# Patient Record
Sex: Male | Born: 1950 | Race: White | Hispanic: No | Smoking: Never smoker
Health system: Southern US, Community
[De-identification: ages and names within clinical notes are randomized; demographics above are authoritative.]

## PROBLEM LIST (undated history)

## (undated) DIAGNOSIS — I10 Essential (primary) hypertension: Secondary | ICD-10-CM

## (undated) DIAGNOSIS — G4700 Insomnia, unspecified: Secondary | ICD-10-CM

## (undated) DIAGNOSIS — E119 Type 2 diabetes mellitus without complications: Secondary | ICD-10-CM

## (undated) DIAGNOSIS — E785 Hyperlipidemia, unspecified: Secondary | ICD-10-CM

## (undated) HISTORY — PX: CORONARY ARTERY BYPASS GRAFT: SHX141

---

## 2005-03-31 ENCOUNTER — Emergency Department (HOSPITAL_COMMUNITY): Admission: EM | Admit: 2005-03-31 | Discharge: 2005-03-31 | Payer: Self-pay | Admitting: Emergency Medicine

## 2005-12-11 ENCOUNTER — Emergency Department (HOSPITAL_COMMUNITY): Admission: EM | Admit: 2005-12-11 | Discharge: 2005-12-11 | Payer: Self-pay | Admitting: Emergency Medicine

## 2006-04-30 ENCOUNTER — Emergency Department (HOSPITAL_COMMUNITY): Admission: EM | Admit: 2006-04-30 | Discharge: 2006-04-30 | Payer: Self-pay | Admitting: Emergency Medicine

## 2006-07-01 ENCOUNTER — Emergency Department (HOSPITAL_COMMUNITY): Admission: EM | Admit: 2006-07-01 | Discharge: 2006-07-01 | Payer: Self-pay | Admitting: Emergency Medicine

## 2016-10-18 ENCOUNTER — Emergency Department (HOSPITAL_COMMUNITY): Payer: Medicare Other

## 2016-10-18 ENCOUNTER — Observation Stay (HOSPITAL_COMMUNITY)
Admission: EM | Admit: 2016-10-18 | Discharge: 2016-10-20 | Disposition: A | Discharge status: Home / Self Care | Payer: Medicare Other | Attending: Nephrology | Admitting: Nephrology

## 2016-10-18 ENCOUNTER — Encounter (HOSPITAL_COMMUNITY): Payer: Self-pay | Admitting: Internal Medicine

## 2016-10-18 DIAGNOSIS — E11649 Type 2 diabetes mellitus with hypoglycemia without coma: Secondary | ICD-10-CM | POA: Diagnosis not present

## 2016-10-18 DIAGNOSIS — K219 Gastro-esophageal reflux disease without esophagitis: Secondary | ICD-10-CM | POA: Diagnosis present

## 2016-10-18 DIAGNOSIS — E1122 Type 2 diabetes mellitus with diabetic chronic kidney disease: Secondary | ICD-10-CM | POA: Insufficient documentation

## 2016-10-18 DIAGNOSIS — I25810 Atherosclerosis of coronary artery bypass graft(s) without angina pectoris: Secondary | ICD-10-CM | POA: Diagnosis present

## 2016-10-18 DIAGNOSIS — N183 Chronic kidney disease, stage 3 unspecified: Secondary | ICD-10-CM | POA: Diagnosis present

## 2016-10-18 DIAGNOSIS — I129 Hypertensive chronic kidney disease with stage 1 through stage 4 chronic kidney disease, or unspecified chronic kidney disease: Secondary | ICD-10-CM | POA: Diagnosis not present

## 2016-10-18 DIAGNOSIS — I10 Essential (primary) hypertension: Secondary | ICD-10-CM | POA: Diagnosis present

## 2016-10-18 DIAGNOSIS — R7401 Elevation of levels of liver transaminase levels: Secondary | ICD-10-CM

## 2016-10-18 DIAGNOSIS — Z79899 Other long term (current) drug therapy: Secondary | ICD-10-CM | POA: Insufficient documentation

## 2016-10-18 DIAGNOSIS — R740 Nonspecific elevation of levels of transaminase and lactic acid dehydrogenase [LDH]: Secondary | ICD-10-CM | POA: Insufficient documentation

## 2016-10-18 DIAGNOSIS — G9341 Metabolic encephalopathy: Secondary | ICD-10-CM | POA: Diagnosis not present

## 2016-10-18 DIAGNOSIS — R634 Abnormal weight loss: Secondary | ICD-10-CM | POA: Diagnosis present

## 2016-10-18 DIAGNOSIS — E162 Hypoglycemia, unspecified: Secondary | ICD-10-CM | POA: Diagnosis not present

## 2016-10-18 DIAGNOSIS — I2510 Atherosclerotic heart disease of native coronary artery without angina pectoris: Secondary | ICD-10-CM | POA: Diagnosis not present

## 2016-10-18 DIAGNOSIS — Z7982 Long term (current) use of aspirin: Secondary | ICD-10-CM | POA: Diagnosis not present

## 2016-10-18 DIAGNOSIS — Z23 Encounter for immunization: Secondary | ICD-10-CM | POA: Diagnosis not present

## 2016-10-18 DIAGNOSIS — Z794 Long term (current) use of insulin: Secondary | ICD-10-CM | POA: Diagnosis not present

## 2016-10-18 DIAGNOSIS — Z7902 Long term (current) use of antithrombotics/antiplatelets: Secondary | ICD-10-CM | POA: Insufficient documentation

## 2016-10-18 HISTORY — DX: Type 2 diabetes mellitus without complications: E11.9

## 2016-10-18 LAB — CBC WITH DIFFERENTIAL/PLATELET
Basophils Absolute: 0 10*3/uL (ref 0.0–0.1)
Basophils Relative: 1 %
EOS PCT: 11 %
Eosinophils Absolute: 1 10*3/uL — ABNORMAL HIGH (ref 0.0–0.7)
HCT: 46.8 % (ref 39.0–52.0)
Hemoglobin: 15.2 g/dL (ref 13.0–17.0)
LYMPHS ABS: 1.1 10*3/uL (ref 0.7–4.0)
LYMPHS PCT: 12 %
MCH: 29.6 pg (ref 26.0–34.0)
MCHC: 32.5 g/dL (ref 30.0–36.0)
MCV: 91.1 fL (ref 78.0–100.0)
MONO ABS: 0.5 10*3/uL (ref 0.1–1.0)
MONOS PCT: 6 %
Neutro Abs: 6 10*3/uL (ref 1.7–7.7)
Neutrophils Relative %: 70 %
PLATELETS: 284 10*3/uL (ref 150–400)
RBC: 5.14 MIL/uL (ref 4.22–5.81)
RDW: 15.1 % (ref 11.5–15.5)
WBC: 8.6 10*3/uL (ref 4.0–10.5)

## 2016-10-18 LAB — URINALYSIS, ROUTINE W REFLEX MICROSCOPIC
BILIRUBIN URINE: NEGATIVE
GLUCOSE, UA: 50 mg/dL — AB
HGB URINE DIPSTICK: NEGATIVE
Ketones, ur: NEGATIVE mg/dL
LEUKOCYTES UA: NEGATIVE
NITRITE: NEGATIVE
PH: 5 (ref 5.0–8.0)
Protein, ur: 30 mg/dL — AB
RBC / HPF: NONE SEEN RBC/hpf (ref 0–5)
SPECIFIC GRAVITY, URINE: 1.025 (ref 1.005–1.030)

## 2016-10-18 LAB — COMPREHENSIVE METABOLIC PANEL
ALBUMIN: 3.9 g/dL (ref 3.5–5.0)
ALT: 40 U/L (ref 17–63)
ANION GAP: 11 (ref 5–15)
AST: 31 U/L (ref 15–41)
Alkaline Phosphatase: 195 U/L — ABNORMAL HIGH (ref 38–126)
BILIRUBIN TOTAL: 0.6 mg/dL (ref 0.3–1.2)
BUN: 17 mg/dL (ref 6–20)
CHLORIDE: 100 mmol/L — AB (ref 101–111)
CO2: 28 mmol/L (ref 22–32)
Calcium: 9.3 mg/dL (ref 8.9–10.3)
Creatinine, Ser: 1.29 mg/dL — ABNORMAL HIGH (ref 0.61–1.24)
GFR calc Af Amer: >60 mL/min (ref >60)
GFR, EST NON AFRICAN AMERICAN: 56 mL/min — AB (ref >60)
Glucose, Bld: 57 mg/dL — ABNORMAL LOW (ref 65–99)
POTASSIUM: 3.9 mmol/L (ref 3.5–5.1)
Sodium: 139 mmol/L (ref 135–145)
TOTAL PROTEIN: 7.5 g/dL (ref 6.5–8.1)

## 2016-10-18 LAB — SEDIMENTATION RATE: SED RATE: 10 mm/h (ref 0–16)

## 2016-10-18 LAB — RAPID URINE DRUG SCREEN, HOSP PERFORMED
AMPHETAMINES: NOT DETECTED
Barbiturates: NOT DETECTED
Benzodiazepines: NOT DETECTED
Cocaine: NOT DETECTED
Opiates: POSITIVE — AB
Tetrahydrocannabinol: NOT DETECTED

## 2016-10-18 LAB — CBG MONITORING, ED
GLUCOSE-CAPILLARY: 49 mg/dL — AB (ref 65–99)
GLUCOSE-CAPILLARY: 46 mg/dL — AB (ref 65–99)
GLUCOSE-CAPILLARY: 230 mg/dL — AB (ref 65–99)
Glucose-Capillary: 197 mg/dL — ABNORMAL HIGH (ref 65–99)

## 2016-10-18 LAB — TSH: TSH: 1.588 u[IU]/mL (ref 0.350–4.500)

## 2016-10-18 LAB — GLUCOSE, CAPILLARY: GLUCOSE-CAPILLARY: 161 mg/dL — AB (ref 65–99)

## 2016-10-18 MED ORDER — ALBUTEROL SULFATE (2.5 MG/3ML) 0.083% IN NEBU: 2.5000 mg | INHALATION_SOLUTION | RESPIRATORY_TRACT | Status: DC | PRN

## 2016-10-18 MED ORDER — ACETAMINOPHEN 325 MG PO TABS
650.0000 mg | ORAL_TABLET | Freq: Four times a day (QID) | ORAL | Status: DC | PRN
  Administered 2016-10-19: 650 mg via ORAL
  Filled 2016-10-18: qty 2

## 2016-10-18 MED ORDER — DEXTROSE 50 % IV SOLN
1.0000 | Freq: Once | INTRAVENOUS | Status: AC
  Administered 2016-10-18: 50 mL via INTRAVENOUS
  Filled 2016-10-18: qty 50

## 2016-10-18 MED ORDER — SODIUM CHLORIDE 0.9 % IV BOLUS (SEPSIS)
500.0000 mL | Freq: Once | INTRAVENOUS | Status: AC
  Administered 2016-10-18: 500 mL via INTRAVENOUS

## 2016-10-18 MED ORDER — ONDANSETRON HCL 4 MG/2ML IJ SOLN: 4.0000 mg | Freq: Four times a day (QID) | INTRAMUSCULAR | Status: DC | PRN

## 2016-10-18 MED ORDER — GI COCKTAIL ~~LOC~~
30.0000 mL | ORAL | Status: AC
  Filled 2016-10-18 (×2): qty 30

## 2016-10-18 MED ORDER — ENOXAPARIN SODIUM 40 MG/0.4ML ~~LOC~~ SOLN
40.0000 mg | Freq: Every day | SUBCUTANEOUS | Status: DC
Start: 2016-10-19 — End: 2016-10-20
  Administered 2016-10-19 – 2016-10-20 (×2): 40 mg via SUBCUTANEOUS
  Filled 2016-10-18 (×2): qty 0.4

## 2016-10-18 MED ORDER — PANTOPRAZOLE SODIUM 40 MG PO TBEC
40.0000 mg | DELAYED_RELEASE_TABLET | Freq: Every day | ORAL | Status: DC
  Administered 2016-10-19 – 2016-10-20 (×2): 40 mg via ORAL
  Filled 2016-10-18 (×2): qty 1

## 2016-10-18 MED ORDER — ACETAMINOPHEN 650 MG RE SUPP: 650.0000 mg | Freq: Four times a day (QID) | RECTAL | Status: DC | PRN

## 2016-10-18 MED ORDER — ONDANSETRON HCL 4 MG PO TABS: 4.0000 mg | ORAL_TABLET | Freq: Four times a day (QID) | ORAL | Status: DC | PRN

## 2016-10-18 MED ORDER — DEXTROSE-NACL 5-0.45 % IV SOLN
INTRAVENOUS | Status: DC
  Administered 2016-10-18: 18:00:00 via INTRAVENOUS

## 2016-10-18 MED ORDER — SODIUM CHLORIDE 0.9% FLUSH: 10.0000 mL | INTRAVENOUS | Status: DC | PRN

## 2016-10-18 NOTE — ED Notes (Signed)
Attempted to call report

## 2016-10-18 NOTE — ED Notes (Signed)
Add on ESR label sent to main lab.

## 2016-10-18 NOTE — ED Triage Notes (Signed)
Pt BIB EMS for hypoglycemia. Per EMS they were called out d/t AMS, pt CBG 37 on assessment. Given oral glucose and 12 g D10 PTA; CBG went up to 80. Pt A&Ox4 on arrival, resp e/u; CBG recheck 49. PIV not established. Pt given PO OJ; EDP at bedisde.  Pt endorses increased weakness, decreased appetite, intermittent diarrhea and nausea x 1 week.

## 2016-10-18 NOTE — ED Notes (Signed)
CBG 230; EDP notified. States to continue dextrose.

## 2016-10-18 NOTE — ED Provider Notes (Signed)
[facility]-EMERGENCY DEPT Provider Note   CSN: [id number] Arrival date & time: 10/18/16  1627     History   Chief Complaint Chief Complaint  Patient presents with  . Hypoglycemia    HPI Philip [name] is a 66 y.o. male.  HPI Patient presents with hypoglycemia. History of diabetes. He is on insulin, metformin, and the shot he takes once a week. Had some confusion home and sugars were 37 for EMS. Given D10 at home. Sugars have come up some. States his sugars have been going down frequently. States he is unsure why. States he has cut his insulin back to half the dose. Recently moved on and get seen over in [place]. States he has lost around 30 pounds over the last few months. He's had a decreased appetite has had decreased oral intake. No abdominal pain. No dysuria. States his mouth feels dry. He does not smoke. No blood in the stool. Has some darkening around his right eye and right forehead area. She gets this when he does not sleep well. No past medical history on file.  There are no active problems to display for this patient.   No past surgical history on file.     Home Medications    Prior to Admission medications   Medication Sig Start Date End Date Taking? Authorizing Provider  amLODipine (NORVASC) 5 MG tablet Take 5 mg by mouth daily.   Yes [provider]  aspirin EC 81 MG tablet Take 81 mg by mouth daily.   Yes [provider]  calcium carbonate (TUMS - DOSED IN MG ELEMENTAL CALCIUM) 500 MG chewable tablet Chew 2 tablets by mouth as needed for indigestion or heartburn.   Yes [provider]  citalopram (CELEXA) 20 MG tablet Take 20 mg by mouth daily.   Yes [provider]  clopidogrel (PLAVIX) 75 MG tablet Take 75 mg by mouth daily.   Yes [provider]  doxylamine, Sleep, (UNISOM) 25 MG tablet Take 25 mg by mouth at bedtime as needed for sleep.   Yes [provider]  Dulaglutide (TRULICITY) 0.75 MG/0.5ML SOPN  Inject into the skin once a week.   Yes [provider]  Esomeprazole Magnesium (NEXIUM PO) Take 1 tablet by mouth 4 (four) times daily as needed (Heartburn).   Yes [provider]  Loperamide HCl (IMODIUM A-D PO) Take 1 tablet by mouth as needed.   Yes [provider]  metFORMIN (GLUCOPHAGE) 1000 MG tablet Take 1,000 mg by mouth 2 (two) times daily with a meal.   Yes [provider]  metoprolol tartrate (LOPRESSOR) 25 MG tablet Take 25 mg by mouth 2 (two) times daily.   Yes [provider]  nitroGLYCERIN (NITROLINGUAL) 0.4 MG/SPRAY spray Place 1 spray under the tongue every 5 (five) minutes x 3 doses as needed for chest pain.   Yes [provider]    Family History No family history on file.  Social History Social History  Substance Use Topics  . Smoking status: Not on file  . Smokeless tobacco: Not on file  . Alcohol use Not on file     Allergies   Codeine   Review of Systems Review of Systems  Constitutional: Positive for appetite change and unexpected weight change.  HENT: Negative for congestion.   Eyes: Negative for photophobia.  Respiratory: Negative for shortness of breath.   Cardiovascular: Negative for chest pain.  Gastrointestinal: Negative for abdominal pain, diarrhea, nausea and vomiting.  Endocrine: Negative for polyuria.  Genitourinary: Negative for flank pain.  Musculoskeletal: Negative for back pain.  Neurological: Negative for syncope and numbness.  Hematological: Does not bruise/bleed easily.  Psychiatric/Behavioral: Positive for confusion.     Physical Exam Updated Vital Signs BP 127/72   Pulse 76   Temp (!) 97.4 F (36.3 C) (Oral)   Resp 16   Ht  (1.702 m)   Wt 70.3 kg (155 lb)   SpO2 99%   BMI 24.28 kg/m   Physical Exam  Constitutional: He appears well-developed.  HENT:  Mucous membranes are dry. Some periorbital darkening on the right side and goes up to the right temporal area.  No swelling.  Eyes: EOM are normal.  Cardiovascular: Normal rate.   Pulmonary/Chest: No respiratory distress. He has no wheezes.  Abdominal: Soft. There is no tenderness.  Musculoskeletal: He exhibits no edema.  Neurological: He is alert.  Skin: Skin is warm. Capillary refill takes less than 2 seconds.     ED Treatments / Results  Labs (all labs ordered are listed, but only abnormal results are displayed) Labs Reviewed  COMPREHENSIVE METABOLIC PANEL - Abnormal; Notable for the following:       Result Value   Chloride 100 (*)    Glucose, Bld 57 (*)    Creatinine, Ser 1.29 (*)    Alkaline Phosphatase 195 (*)    GFR calc non Af Amer 56 (*)    All other components within normal limits  CBC WITH DIFFERENTIAL/PLATELET - Abnormal; Notable for the following:    Eosinophils Absolute 1.0 (*)    All other components within normal limits  CBG MONITORING, ED - Abnormal; Notable for the following:    Glucose-Capillary 49 (*)    All other components within normal limits  CBG MONITORING, ED - Abnormal; Notable for the following:    Glucose-Capillary 46 (*)    All other components within normal limits  CBG MONITORING, ED - Abnormal; Notable for the following:    Glucose-Capillary 230 (*)    All other components within normal limits  TSH  URINALYSIS, ROUTINE W REFLEX MICROSCOPIC  RAPID URINE DRUG SCREEN, HOSP PERFORMED  CBG MONITORING, ED    EKG  EKG Interpretation None       Radiology Dg Chest 2 View  Result Date: 10/18/2016 CLINICAL DATA:  Hypoglycemia. Altered mental status. Nausea for 1 week. EXAM: CHEST  2 VIEW COMPARISON:  12/11/2005 FINDINGS: The lungs are clear. The pulmonary vasculature is normal. Heart size is normal. Hilar and mediastinal contours are unremarkable. There is no pleural effusion. Prior sternotomy. IMPRESSION: No active cardiopulmonary disease. Electronically Signed   By: Ellery Plunk M.D.   On: 10/18/2016 18:21    Procedures Procedures (including  critical care time)  Medications Ordered in ED Medications  dextrose 5 %-0.45 % sodium chloride infusion ( Intravenous New Bag/Given 10/18/16 1743)  sodium chloride 0.9 % bolus 500 mL (0 mLs Intravenous Stopped 10/18/16 1839)  dextrose 50 % solution 50 mL (50 mLs Intravenous Given 10/18/16 1743)     Initial Impression / Assessment and Plan / ED Course  I have reviewed the triage vital signs and the nursing notes.  Pertinent labs & imaging results that were available during my care of the patient were reviewed by me and considered in my medical decision making (see chart for details).   patient with hypoglycemia. Also has had weight loss. Has had decreased appetite and some diarrhea with some decreased oral intake but states for the last few months he has  not really had any appetite at all. Sugar 37 at home. In the 40s here after oral and IV treatment started on D5 drip and increased oral intake. Will require admission for further monitoring.  Final Clinical Impressions(s) / ED Diagnoses   Final diagnoses:  Hypoglycemia  Weight loss, unintentional    New Prescriptions New Prescriptions   No medications on file     Benjiman Core, MD 10/18/16 1932

## 2016-10-18 NOTE — H&P (Addendum)
History and Physical    Philip [name] VOH:[id number] DOB: 04-14-1950 DOA: 10/18/2016  Referring MD/NP/PA: Dr. Davonna Belling PCP: Patient, No Pcp Per  Patient coming from: Home via EMS  Chief Complaint: Low blood sugar  HPI: Orie Louderback is a 66 y.o. male with medical history significant of HTN, DM type II, CAD s/p CABG; who presents after being found with on the floor by his grandson. Patient's daughter with whom he now lives since moving from [place], [place] checked his blood sugar and it was 27 that time. Patient notes being on metformin, Trulicity, and 69/48 insulin. He is self prescribes the dosage of 70/30 insulin, and currently reported taking 30 units subcutaneously twice daily. Last reports taking Trulicity 2 days ago and 30 units of 70/30 insulin this morning. Family members note that patient had a similar episode like this approximately one month ago where he was found in his blood sugar was 11 at that time. The patient notes that he does not routinely check his blood sugars, but since moving his daughter checks them twice daily. Other associated symptoms include decreased overall appetite, weight loss of 30 pounds, nausea, and intermittent diarrhea. His previous PCP was in [place] but since moving here is not establish care with a primary care provider in this area. He has never had a colonoscopy or prostate screening.   ED Course:  Upon EMS arrival patient was noted to have CBG 37 for which he was given oral glucose and 12g of D10. CBG went up to 80, but recheck on arrival was 49. Patient's vital signs were relatively within normal limits. Labs revealed BUN 17, creatinine 1.29, glucose 57, TSH wnl, and alkaline phosphatase 195. He was given some orange juice and subsequently required an amp of D50 as CBGs were reported as low as 46. Patient was subsequently placed on D5 .45NS at 75 ml/hr. TRH called to admit overnight.   Review of Systems  Constitutional: Positive for  weight loss. Negative for chills and fever.  HENT: Negative for ear discharge and ear pain.   Eyes: Negative for photophobia and pain.  Respiratory: Negative for hemoptysis and sputum production.   Cardiovascular: Negative for orthopnea and leg swelling.  Gastrointestinal: Positive for diarrhea and nausea. Negative for blood in stool and constipation.  Genitourinary: Negative for dysuria and frequency.  Musculoskeletal: Negative for back pain and neck pain.  Skin: Positive for rash (Chronic gray rash over right). Negative for itching.  Neurological: Negative for focal weakness and seizures.  Psychiatric/Behavioral: Negative for hallucinations and substance abuse.    Past Medical History:  Diagnosis Date  . Diabetes mellitus ([place])     Past Surgical History:  Procedure Laterality Date  . CORONARY ARTERY BYPASS GRAFT       has no tobacco, alcohol, and drug history on file.  Allergies  Allergen Reactions  . Codeine     GI upset    Family History  Problem Relation Age of Onset  . Colon cancer Father     Prior to Admission medications   Medication Sig Start Date End Date Taking? Authorizing Provider  amLODipine (NORVASC) 5 MG tablet Take 5 mg by mouth daily.   Yes [provider]  aspirin EC 81 MG tablet Take 81 mg by mouth daily.   Yes [provider]  calcium carbonate (TUMS - DOSED IN MG ELEMENTAL CALCIUM) 500 MG chewable tablet Chew 2 tablets by mouth as needed for indigestion or heartburn.   Yes [provider]  citalopram (  CELEXA) 20 MG tablet Take 20 mg by mouth daily.   Yes [provider]  clopidogrel (PLAVIX) 75 MG tablet Take 75 mg by mouth daily.   Yes [provider]  doxylamine, Sleep, (UNISOM) 25 MG tablet Take 25 mg by mouth at bedtime as needed for sleep.   Yes [provider]  Dulaglutide (TRULICITY) 7.00 FV/4.9SW SOPN Inject into the skin once a week.   Yes [provider]  Esomeprazole Magnesium  (NEXIUM PO) Take 1 tablet by mouth 4 (four) times daily as needed (Heartburn).   Yes [provider]  Loperamide HCl (IMODIUM A-D PO) Take 1 tablet by mouth as needed.   Yes [provider]  metFORMIN (GLUCOPHAGE) 1000 MG tablet Take 1,000 mg by mouth 2 (two) times daily with a meal.   Yes [provider]  metoprolol tartrate (LOPRESSOR) 25 MG tablet Take 25 mg by mouth 2 (two) times daily.   Yes [provider]  nitroGLYCERIN (NITROLINGUAL) 0.4 MG/SPRAY spray Place 1 spray under the tongue every 5 (five) minutes x 3 doses as needed for chest pain.   Yes [provider]    Physical Exam:  Constitutional: Elderly male who appears to be in NAD, calm, comfortable Vitals:   10/18/16 1655 10/18/16 1700 10/18/16 1715 10/18/16 1927  BP: (!) 118/59 112/76 116/70 127/72  Pulse: 100 74 74 76  Resp: 16     Temp: (!) 97.4 F (36.3 C)     TempSrc: Oral     SpO2: 98% 100% 98% 99%  Weight: 70.3 kg (155 lb)     Height: '5\' 7"'  (1.702 m)      Eyes: PERRL, lids and conjunctivae normal ENMT: Mucous membranes are dry. Posterior pharynx clear of any exudate or lesions.  Neck: normal, supple, no masses, no thyromegaly Respiratory: clear to auscultation bilaterally, no wheezing, no crackles. Normal respiratory effort. No accessory muscle use.  Cardiovascular: Regular rate and rhythm, no murmurs / rubs / gallops. No extremity edema. 2+ pedal pulses. No carotid bruits.  Abdomen: no tenderness, no masses palpated. No hepatosplenomegaly. Bowel sounds positive.  Musculoskeletal: no clubbing / cyanosis. No joint deformity upper and lower extremities. Good ROM, no contractures. Normal muscle tone.  Skin:  Pearline Cables coloration of the right eye Neurologic: CN 2-12 grossly intact. Sensation intact, DTR normal. Strength 5/5 in all 4.  Psychiatric: Normal judgment and insight. Alert and oriented x 3. Normal mood.     Labs on Admission: I have personally reviewed following labs  and imaging studies  CBC:  Recent Labs Lab 10/18/16 1704  WBC 8.6  NEUTROABS 6.0  HGB 15.2  HCT 46.8  MCV 91.1  PLT 967   Basic Metabolic Panel:  Recent Labs Lab 10/18/16 1704  NA 139  K 3.9  CL 100*  CO2 28  GLUCOSE 57*  BUN 17  CREATININE 1.29*  CALCIUM 9.3   GFR: Estimated Creatinine Clearance: 52 mL/min (A) (by C-G formula based on SCr of 1.29 mg/dL (H)). Liver Function Tests:  Recent Labs Lab 10/18/16 1704  AST 31  ALT 40  ALKPHOS 195*  BILITOT 0.6  PROT 7.5  ALBUMIN 3.9   No results for input(s): LIPASE, AMYLASE in the last 168 hours. No results for input(s): AMMONIA in the last 168 hours. Coagulation Profile: No results for input(s): INR, PROTIME in the last 168 hours. Cardiac Enzymes: No results for input(s): CKTOTAL, CKMB, CKMBINDEX, TROPONINI in the last 168 hours. BNP (last 3 results) No results for input(s):  PROBNP in the last 8760 hours. HbA1C: No results for input(s): HGBA1C in the last 72 hours. CBG:  Recent Labs Lab 10/18/16 1654 10/18/16 1734 10/18/16 1841  GLUCAP 49* 46* 230*   Lipid Profile: No results for input(s): CHOL, HDL, LDLCALC, TRIG, CHOLHDL, LDLDIRECT in the last 72 hours. Thyroid Function Tests:  Recent Labs  10/18/16 1730  TSH 1.588   Anemia Panel: No results for input(s): VITAMINB12, FOLATE, FERRITIN, TIBC, IRON, RETICCTPCT in the last 72 hours. Urine analysis:    Component Value Date/Time   COLORURINE YELLOW 10/18/2016 1927   APPEARANCEUR CLEAR 10/18/2016 1927   LABSPEC 1.025 10/18/2016 1927   PHURINE 5.0 10/18/2016 1927   GLUCOSEU 50 (A) 10/18/2016 1927   HGBUR NEGATIVE 10/18/2016 [place] NEGATIVE 10/18/2016 [place] NEGATIVE 10/18/2016 1927   PROTEINUR 30 (A) 10/18/2016 1927   NITRITE NEGATIVE 10/18/2016 1927   LEUKOCYTESUR NEGATIVE 10/18/2016 1927   Sepsis Labs: No results found for this or any previous visit (from the past 240 hour(s)).   Radiological Exams on  Admission: Dg Chest 2 View  Result Date: 10/18/2016 CLINICAL DATA:  Hypoglycemia. Altered mental status. Nausea for 1 week. EXAM: CHEST  2 VIEW COMPARISON:  12/11/2005 FINDINGS: The lungs are clear. The pulmonary vasculature is normal. Heart size is normal. Hilar and mediastinal contours are unremarkable. There is no pleural effusion. Prior sternotomy. IMPRESSION: No active cardiopulmonary disease. Electronically Signed   By: Andreas Newport M.D.   On: 10/18/2016 18:21    Chest x-ray: Independently reviewed. No acute abnormalities  Assessment/Plan Hypoglycemia: Acute. Initial blood glucose to be as low as 27. Patient reports self prescribing his dosage of 70/30 insulin taking 30 units twice daily despite multiple recent hypoglycemic episodes. Patient received - Admit to a telemetry bed - Hypoglycemic protocols - Check hemoglobin A1c in a.m. - Hold metformin, Trulicity, and 14/64 insulin - D5.45% NS at 75 ml/hr - CBGs every 4 hours overnight - Adjust medications in a.m.   Essential hypertension - Continue amlodipine and metoprolol   CAD s/p CABG - Continue Plavix and aspirin   Chronic kidney disease stage III: Creatinine 1.29 on admission no baseline creatinine to compare. - Recheck BMP in a.m.  Depression - Continue Celexa  Weight loss: Patient reports approximately 30 pound weight loss. Noted to have normal TSH on admission. Question possibility of underlying malignancy or uncontrolled diabetes is causing symptoms. - check ESR - Encourage patient to have routine screening for age  Elevated alkaline phosphatase: Alkaline phosphatase elevated at 195 on admission.  [place] substitution of Protonix   DVT prophylaxis: lovenox Code Status: Full  Family Communication: Discussed plan of care with patient and family present at bedside Disposition Plan:   Consults called:  none Admission status: Observation  Norval Morton MD [facility] Pager  [phone]   If 7PM-7AM, please contact night-coverage [email or web address] Password [facility]  10/18/2016, 7:57 PM

## 2016-10-18 NOTE — ED Notes (Signed)
Checked CBG 197, RN Jessica informed

## 2016-10-18 NOTE — ED Notes (Signed)
Attempted PIV x2. 2nd RN to attempt.  

## 2016-10-19 ENCOUNTER — Observation Stay (HOSPITAL_COMMUNITY): Payer: Medicare Other

## 2016-10-19 ENCOUNTER — Encounter (HOSPITAL_COMMUNITY): Payer: Self-pay | Admitting: *Deleted

## 2016-10-19 DIAGNOSIS — I25810 Atherosclerosis of coronary artery bypass graft(s) without angina pectoris: Secondary | ICD-10-CM | POA: Diagnosis present

## 2016-10-19 DIAGNOSIS — E11649 Type 2 diabetes mellitus with hypoglycemia without coma: Secondary | ICD-10-CM | POA: Diagnosis not present

## 2016-10-19 DIAGNOSIS — K219 Gastro-esophageal reflux disease without esophagitis: Secondary | ICD-10-CM | POA: Diagnosis present

## 2016-10-19 DIAGNOSIS — I10 Essential (primary) hypertension: Secondary | ICD-10-CM | POA: Diagnosis not present

## 2016-10-19 DIAGNOSIS — R7401 Elevation of levels of liver transaminase levels: Secondary | ICD-10-CM

## 2016-10-19 DIAGNOSIS — E162 Hypoglycemia, unspecified: Secondary | ICD-10-CM | POA: Diagnosis not present

## 2016-10-19 DIAGNOSIS — R634 Abnormal weight loss: Secondary | ICD-10-CM | POA: Diagnosis present

## 2016-10-19 DIAGNOSIS — R740 Nonspecific elevation of levels of transaminase and lactic acid dehydrogenase [LDH]: Secondary | ICD-10-CM

## 2016-10-19 DIAGNOSIS — N183 Chronic kidney disease, stage 3 unspecified: Secondary | ICD-10-CM | POA: Diagnosis present

## 2016-10-19 LAB — CBC
HEMATOCRIT: 35.2 % — AB (ref 39.0–52.0)
Hemoglobin: 11.3 g/dL — ABNORMAL LOW (ref 13.0–17.0)
MCH: 28.9 pg (ref 26.0–34.0)
MCHC: 32.4 g/dL (ref 30.0–36.0)
MCV: 89.3 fL (ref 78.0–100.0)
Platelets: 222 10*3/uL (ref 150–400)
RBC: 3.94 MIL/uL — ABNORMAL LOW (ref 4.22–5.81)
RDW: 14.9 % (ref 11.5–15.5)
WBC: 7.9 10*3/uL (ref 4.0–10.5)

## 2016-10-19 LAB — COMPREHENSIVE METABOLIC PANEL
ALT: 83 U/L — AB (ref 17–63)
ANION GAP: 8 (ref 5–15)
AST: 90 U/L — ABNORMAL HIGH (ref 15–41)
Albumin: 2.7 g/dL — ABNORMAL LOW (ref 3.5–5.0)
Alkaline Phosphatase: 279 U/L — ABNORMAL HIGH (ref 38–126)
BUN: 15 mg/dL (ref 6–20)
CHLORIDE: 103 mmol/L (ref 101–111)
CO2: 26 mmol/L (ref 22–32)
CREATININE: 1.12 mg/dL (ref 0.61–1.24)
Calcium: 8.1 mg/dL — ABNORMAL LOW (ref 8.9–10.3)
GFR calc non Af Amer: >60 mL/min (ref >60)
Glucose, Bld: 227 mg/dL — ABNORMAL HIGH (ref 65–99)
POTASSIUM: 4.6 mmol/L (ref 3.5–5.1)
SODIUM: 137 mmol/L (ref 135–145)
Total Bilirubin: 0.8 mg/dL (ref 0.3–1.2)
Total Protein: 5.2 g/dL — ABNORMAL LOW (ref 6.5–8.1)

## 2016-10-19 LAB — HEMOGLOBIN A1C
Hgb A1c MFr Bld: 6.7 % — ABNORMAL HIGH (ref 4.8–5.6)
Mean Plasma Glucose: 145.59 mg/dL

## 2016-10-19 LAB — GLUCOSE, CAPILLARY
GLUCOSE-CAPILLARY: 214 mg/dL — AB (ref 65–99)
GLUCOSE-CAPILLARY: 277 mg/dL — AB (ref 65–99)
GLUCOSE-CAPILLARY: 346 mg/dL — AB (ref 65–99)
Glucose-Capillary: 273 mg/dL — ABNORMAL HIGH (ref 65–99)
Glucose-Capillary: 188 mg/dL — ABNORMAL HIGH (ref 65–99)
Glucose-Capillary: 210 mg/dL — ABNORMAL HIGH (ref 65–99)

## 2016-10-19 MED ORDER — CITALOPRAM HYDROBROMIDE 20 MG PO TABS
20.0000 mg | ORAL_TABLET | Freq: Every day | ORAL | Status: DC
  Administered 2016-10-19 – 2016-10-20 (×2): 20 mg via ORAL
  Filled 2016-10-19 (×2): qty 1

## 2016-10-19 MED ORDER — ADULT MULTIVITAMIN W/MINERALS CH
1.0000 | ORAL_TABLET | Freq: Every day | ORAL | Status: DC
  Administered 2016-10-19 – 2016-10-20 (×2): 1 via ORAL
  Filled 2016-10-19 (×2): qty 1

## 2016-10-19 MED ORDER — CLOPIDOGREL BISULFATE 75 MG PO TABS
75.0000 mg | ORAL_TABLET | Freq: Every day | ORAL | Status: DC
  Administered 2016-10-19 – 2016-10-20 (×2): 75 mg via ORAL
  Filled 2016-10-19 (×2): qty 1

## 2016-10-19 MED ORDER — ASPIRIN EC 81 MG PO TBEC
81.0000 mg | DELAYED_RELEASE_TABLET | Freq: Every day | ORAL | Status: DC
  Administered 2016-10-19 – 2016-10-20 (×2): 81 mg via ORAL
  Filled 2016-10-19 (×2): qty 1

## 2016-10-19 MED ORDER — INFLUENZA VAC SPLIT HIGH-DOSE 0.5 ML IM SUSY
0.5000 mL | PREFILLED_SYRINGE | INTRAMUSCULAR | Status: AC
  Administered 2016-10-20: 0.5 mL via INTRAMUSCULAR
  Filled 2016-10-19 (×2): qty 0.5

## 2016-10-19 MED ORDER — AMLODIPINE BESYLATE 5 MG PO TABS
5.0000 mg | ORAL_TABLET | Freq: Every day | ORAL | Status: DC
  Administered 2016-10-19 – 2016-10-20 (×2): 5 mg via ORAL
  Filled 2016-10-19 (×2): qty 1

## 2016-10-19 MED ORDER — ENSURE ENLIVE PO LIQD
237.0000 mL | Freq: Two times a day (BID) | ORAL | Status: DC
  Administered 2016-10-19: 237 mL via ORAL

## 2016-10-19 MED ORDER — TRAZODONE HCL 50 MG PO TABS
50.0000 mg | ORAL_TABLET | Freq: Once | ORAL | Status: AC
Start: 2016-10-19 — End: 2016-10-19
  Administered 2016-10-19: 50 mg via ORAL

## 2016-10-19 MED ORDER — DIPHENHYDRAMINE HCL 25 MG PO CAPS
25.0000 mg | ORAL_CAPSULE | Freq: Every evening | ORAL | Status: DC | PRN
  Administered 2016-10-19: 25 mg via ORAL
  Filled 2016-10-19: qty 1

## 2016-10-19 MED ORDER — METOPROLOL TARTRATE 25 MG PO TABS
25.0000 mg | ORAL_TABLET | Freq: Two times a day (BID) | ORAL | Status: DC
  Administered 2016-10-19 – 2016-10-20 (×4): 25 mg via ORAL
  Filled 2016-10-19 (×4): qty 1

## 2016-10-19 NOTE — Progress Notes (Signed)
PROGRESS NOTE    Philip [name]  ZOX:[id number] DOB: 1950-10-11 DOA: 10/18/2016 PCP: Patient, No Pcp Per   Brief Narrative:   66 y.o. male with medical history significant of HTN, DM type II, CAD s/p CABG; who presents after being found with on the floor by his grandson.patient was found to have blood sugar level 27.Patient notes being on metformin, Trulicity, and 70/30 insulin. He is self prescribes the dosage of 70/30 insulin, and currently reported taking 30 units subcutaneously twice daily  Assessment & Plan:  # Hypoglycemia: likely due to self prescribing insulin.Patient was prescribed metformin and trulicity only. He brought the insulin from [facility] because he wanted to control his blood sugar better. Education provided to the patient regarding not taking medication without consulting his PCP for DM management.  -discontinue insulin and holdoral hypoglycemic agent. Blood sugar is better now.  #Acute metabolic encephalopathy in the setting of hypoglycemia. Of mental status improved. He has no focal neurological deficit.  #Transaminitis: Unknown etiology.liver enzymes are trending up today. Patient has no nausea vomiting or abdominal pain. I will ultrasound right upper quadrant and hepatitis panel. Repeat lab in the morning. Discussed with the patient.  #Essential hypertension: Continue amlodipine and metoprolol. Monitor blood pressure.  #CAD status post CABG: Continue Plavix and aspirin.  #Chronic kidney disease stage III: Monitor BMP. Avoid nephrotoxins.  DVT prophylaxis: Lovenox subcutaneous Code Status:full code Family Communication:no family at bedside Disposition Plan:currently admitted    Consultants:   none  Procedures:none Antimicrobials:none  Subjective: Seen and examined at bedside. Feels better now. Denied nausea vomiting chest pain or shortness of breath. No  Pain.  Objective: Vitals:   10/18/16 2045 10/18/16 2134 10/19/16 0424 10/19/16 1000  BP: 119/69  135/73 (!) 115/59 115/75  Pulse: 72 77 72 74  Resp:  Temp:  98.1 F (36.7 C) 98.5 F (36.9 C) 98.6 F (37 C)  TempSrc:  Oral Oral Oral  SpO2: 99% 100% 95% 96%  Weight:  72 kg (158 lb 12.8 oz)    Height:   (1.702 m)      Intake/Output Summary (Last 24 hours) at 10/19/16 1352 Last data filed at 10/19/16 1012  Gross per 24 hour  Intake              740 ml  Output                0 ml  Net              740 ml   Filed Weights   10/18/16 1655 10/18/16 2134  Weight: 70.3 kg (155 lb) 72 kg (158 lb 12.8 oz)    Examination:  General exam: Appears calm and comfortable  Respiratory system: Clear to auscultation. Respiratory effort normal. No wheezing or crackle Cardiovascular system: S1 & S2 heard, RRR.  No pedal edema. Gastrointestinal system: Abdomen is nondistended, soft and nontender. Normal bowel sounds heard. Central nervous system: Alert and oriented. No focal neurological deficits. Extremities: Symmetric 5 x 5 power. Skin: No rashes, lesions or ulcers Psychiatry: Judgement and insight appear normal. Mood & affect appropriate.     Data Reviewed: I have personally reviewed following labs and imaging studies  CBC:  Recent Labs Lab 10/18/16 1704 10/19/16 0407  WBC 8.6 7.9  NEUTROABS 6.0  --   HGB 15.2 11.3*  HCT 46.8 35.2*  MCV 91.1 89.3  PLT 284 222   Basic Metabolic Panel:  Recent Labs Lab 10/18/16 1704 10/19/16 0407  NA  139 137  K 3.9 4.6  CL 100* 103  CO2 28 26  GLUCOSE 57* 227*  BUN 17 15  CREATININE 1.29* 1.12  CALCIUM 9.3 8.1*   GFR: Estimated Creatinine Clearance: 59.8 mL/min (by C-G formula based on SCr of 1.12 mg/dL). Liver Function Tests:  Recent Labs Lab 10/18/16 1704 10/19/16 0407  AST 31 90*  ALT 40 83*  ALKPHOS 195* 279*  BILITOT 0.6 0.8  PROT 7.5 5.2*  ALBUMIN 3.9 2.7*   No results for input(s): LIPASE, AMYLASE in the last 168 hours. No results for input(s): AMMONIA in the last 168 hours. Coagulation  Profile: No results for input(s): INR, PROTIME in the last 168 hours. Cardiac Enzymes: No results for input(s): CKTOTAL, CKMB, CKMBINDEX, TROPONINI in the last 168 hours. BNP (last 3 results) No results for input(s): PROBNP in the last 8760 hours. HbA1C:  Recent Labs  10/19/16 0407  HGBA1C 6.7*   CBG:  Recent Labs Lab 10/18/16 2020 10/18/16 2139 10/19/16 0117 10/19/16 0428 10/19/16 0807  GLUCAP 197* 161* 273* 214* 188*   Lipid Profile: No results for input(s): CHOL, HDL, LDLCALC, TRIG, CHOLHDL, LDLDIRECT in the last 72 hours. Thyroid Function Tests:  Recent Labs  10/18/16 1730  TSH 1.588   Anemia Panel: No results for input(s): VITAMINB12, FOLATE, FERRITIN, TIBC, IRON, RETICCTPCT in the last 72 hours. Sepsis Labs: No results for input(s): PROCALCITON, LATICACIDVEN in the last 168 hours.  No results found for this or any previous visit (from the past 240 hour(s)).       Radiology Studies: Dg Chest 2 View  Result Date: 10/18/2016 CLINICAL DATA:  Hypoglycemia. Altered mental status. Nausea for 1 week. EXAM: CHEST  2 VIEW COMPARISON:  12/11/2005 FINDINGS: The lungs are clear. The pulmonary vasculature is normal. Heart size is normal. Hilar and mediastinal contours are unremarkable. There is no pleural effusion. Prior sternotomy. IMPRESSION: No active cardiopulmonary disease. Electronically Signed   By: Ellery Plunk M.D.   On: 10/18/2016 18:21        Scheduled Meds: . amLODipine  5 mg Oral Daily  . aspirin EC  81 mg Oral Daily  . citalopram  20 mg Oral Daily  . clopidogrel  75 mg Oral Daily  . enoxaparin (LOVENOX) injection  40 mg Subcutaneous Daily  . feeding supplement (ENSURE ENLIVE)  237 mL Oral BID BM  . gi cocktail  30 mL Oral STAT  . [START ON 10/20/2016] Influenza vac split quadrivalent PF  0.5 mL Intramuscular Tomorrow-1000  . metoprolol tartrate  25 mg Oral BID  . pantoprazole  40 mg Oral Daily   Continuous Infusions:   LOS: 0 days     Carylon Tamburro Jaynie Collins, MD [facility] Pager [phone]  If 7PM-7AM, please contact night-coverage [email or web address] Password [email or web address] 10/19/2016, 1:52 PM

## 2016-10-19 NOTE — Progress Notes (Signed)
Initial Nutrition Assessment  DOCUMENTATION CODES:  Non-severe (moderate) malnutrition in context of acute illness/injury  INTERVENTION:  Continue Ensure Enlive po BID, each supplement provides 350 kcal and 20 grams of protein  Order mvi with minerals given prolonged period of poor po intake, wound, and no supplementation at home  Food requests noted, to be placed on tray.   NUTRITION DIAGNOSIS:  Unintentional weight loss related to poor appetite as evidenced by Reported wt loss of 30 lbs over past few months.  GOAL:  Patient will meet greater than or equal to 90% of their needs  MONITOR:  PO intake, Supplement acceptance, Diet advancement, Labs, Weight trends, Skin, Workup  REASON FOR ASSESSMENT:  Malnutrition Screening Tool    ASSESSMENT:  66 y/o male PMHx HTN, DM2, CKD3, CAD s/p CABG, CVA.  Presents after being found on floor by grandson. BG checked and was 27 mg/dl. Additionally, patient reports a poor appetite, weight loss of 30 lbs in past few months, nausea, intermittent diarrhea. Work up revealed elevated Alk phos. Admitted for management of blood sugars.   Patients liver enzymes increased today and to undergo [place] of liver this afternoon.   Patient reports that he has very minimal appetite. He will sit down, take a few bites, but then immediately lose all appetite and stop eating. This has been going on for approximately since he started losing weight a few months ago. At baseline he does not take vitamins, dirnk supplements, or follow a diabetic diet. Infact, he says he is a "terrible diabetic". He never checked his BGs when he lived on his own. Reports his A1C has been as high as 13.   RD asked about changes that changes that may have impacted his weight. He says he has a wound on his foot for which he had been going to a wound clinic in [facility]. He had to wear a boot x4 weeks. He says it had gotten better, but he sees in declining again. Also, He says he suffered a CVA  in July. His has mild R side hemiplegia. He has trouble holding items with R hand. Finally he has increased stress from moving to [place] and trying to raise his grandson on his own/   Regarding his diarrhea. He says he would just have occasionally in the morning. He makes it sound to be rather insignificant  He stays his UBW for the past 25 years has been 180 lbs. There is no documented weight history available to compare reference  NFPE: Moderate Orbital and underarm fat wasting is prominent. Mild muscle wasting of interosseous and temporalis.   At this time, pt says he ate well for breakfast. He is currently NPO for his imaging. He was agreeable to supplements/mvi once diet advanced. Took food requests  Labs: Albumin: 2.7, Elevated liver enzymes, Alk phos 279-increasing. BGs 160-230. A1C 6.7,  Meds: Ensure Enlive, ppi,     Recent Labs Lab 10/18/16 1704 10/19/16 0407  NA 139 137  K 3.9 4.6  CL 100* 103  CO2 28 26  BUN 17 15  CREATININE 1.29* 1.12  CALCIUM 9.3 8.1*  GLUCOSE 57* 227*   Diet Order:  Diet heart healthy/carb modified Room service appropriate? Yes; Fluid consistency: Thin  Skin:  Diabetic ulcer/Wound to L foot.   Last BM:  10/12  Height:  Ht Readings from Last 1 Encounters:  10/18/16 '5\' 7"'  (1.702 m)   Weight:  Wt Readings from Last 1 Encounters:  10/18/16 158 lb 12.8 oz (72 kg)  Ideal Body Weight:  67.27 kg  BMI:  Body mass index is 24.87 kg/m.  Estimated Nutritional Needs:  Kcal:  1950-2150 (27-30 kcal/kg bw) Protein:  86-101 g Pro (1.2-1.4 g/kg bw) Fluid:  >2.2 L (30 ml/kg bw)  EDUCATION NEEDS:  No education needs identified at this time  Burtis Junes RD, LDN, [facility] Pager: [phone] 10/19/2016 3:46 PM

## 2016-10-19 NOTE — Progress Notes (Signed)
NURSING PROGRESS NOTE  Philip [name] [id number] Admission Data: 10/19/2016 2:30 AM Attending Provider: Clydie Braun, MD WUJ:[id number], No Pcp Per Code Status: Full  Philip [name] is a 66 y.o. male patient admitted from ED:  -No acute distress noted.  -No complaints of shortness of breath.  -No complaints of chest pain.   Cardiac Monitoring: Box #27 in place. Cardiac monitor yields:normal sinus rhythm.  Blood pressure 135/73, pulse 77, temperature 98.1 F (36.7 C), temperature source Oral, resp. rate 18, height  (1.702 m), weight 72 kg (158 lb 12.8 oz), SpO2 100 %.   IV Fluids:  IV in place, occlusive dsg intact without redness, IV cath forearm right,and infiltrated with D5W/0.45 NaCl  Infusing. IV team was notified and placed an extended cath in Left Forearm.   Allergies:  Codeine  Past Medical History:   has a past medical history of Diabetes mellitus (HCC).  Past Surgical History:   has a past surgical history that includes Coronary artery bypass graft.  Social History:   reports that he has never smoked. He has never used smokeless tobacco. He reports that he does not drink alcohol or use drugs.  Skin: Patient has an old callused right foot wound 0.5cm x 1cm and 1cm x 0.5cm wound on his left foot. Cleaned both with NS and dressed with a foam dsg  Patient/Family orientated to room. Information packet given to patient/family. Admission inpatient armband information verified with patient/family to include name and date of birth and placed on patient arm. Side rails up x 2, fall assessment and education completed with patient/family. Patient/family able to verbalize understanding of risk associated with falls and verbalized understanding to call for assistance before getting out of bed. Call light within reach. Patient/family able to voice and demonstrate understanding of unit orientation instructions.    Will continue to evaluate and treat per MD orders.

## 2016-10-20 DIAGNOSIS — E11649 Type 2 diabetes mellitus with hypoglycemia without coma: Secondary | ICD-10-CM | POA: Diagnosis not present

## 2016-10-20 DIAGNOSIS — E162 Hypoglycemia, unspecified: Secondary | ICD-10-CM | POA: Diagnosis not present

## 2016-10-20 DIAGNOSIS — I10 Essential (primary) hypertension: Secondary | ICD-10-CM | POA: Diagnosis not present

## 2016-10-20 LAB — COMPREHENSIVE METABOLIC PANEL
ALK PHOS: 231 U/L — AB (ref 38–126)
ALT: 49 U/L (ref 17–63)
AST: 25 U/L (ref 15–41)
Albumin: 2.9 g/dL — ABNORMAL LOW (ref 3.5–5.0)
Anion gap: 8 (ref 5–15)
BILIRUBIN TOTAL: 0.6 mg/dL (ref 0.3–1.2)
BUN: 13 mg/dL (ref 6–20)
CALCIUM: 8.6 mg/dL — AB (ref 8.9–10.3)
CO2: 27 mmol/L (ref 22–32)
Chloride: 102 mmol/L (ref 101–111)
Creatinine, Ser: 1.19 mg/dL (ref 0.61–1.24)
GFR calc Af Amer: >60 mL/min (ref >60)
GLUCOSE: 337 mg/dL — AB (ref 65–99)
POTASSIUM: 4.6 mmol/L (ref 3.5–5.1)
Sodium: 137 mmol/L (ref 135–145)
TOTAL PROTEIN: 5.9 g/dL — AB (ref 6.5–8.1)

## 2016-10-20 LAB — CBC
HEMATOCRIT: 38.7 % — AB (ref 39.0–52.0)
HEMOGLOBIN: 12.4 g/dL — AB (ref 13.0–17.0)
MCH: 28.7 pg (ref 26.0–34.0)
MCHC: 32 g/dL (ref 30.0–36.0)
MCV: 89.6 fL (ref 78.0–100.0)
Platelets: 232 10*3/uL (ref 150–400)
RBC: 4.32 MIL/uL (ref 4.22–5.81)
RDW: 15.1 % (ref 11.5–15.5)
WBC: 6.6 10*3/uL (ref 4.0–10.5)

## 2016-10-20 LAB — GLUCOSE, CAPILLARY
GLUCOSE-CAPILLARY: 329 mg/dL — AB (ref 65–99)
GLUCOSE-CAPILLARY: 303 mg/dL — AB (ref 65–99)
Glucose-Capillary: 365 mg/dL — ABNORMAL HIGH (ref 65–99)

## 2016-10-20 MED ORDER — INSULIN NPH (HUMAN) (ISOPHANE) 100 UNIT/ML ~~LOC~~ SUSP: 5.0000 [IU] | Freq: Two times a day (BID) | SUBCUTANEOUS | 0 refills | Status: AC

## 2016-10-20 MED ORDER — ESOMEPRAZOLE MAGNESIUM 40 MG PO CPDR: 40.0000 mg | DELAYED_RELEASE_CAPSULE | Freq: Every day | ORAL | Status: DC | PRN

## 2016-10-20 MED ORDER — ADULT MULTIVITAMIN W/MINERALS CH: 1.0000 | ORAL_TABLET | Freq: Every day | ORAL | 0 refills | Status: DC

## 2016-10-20 MED ORDER — INSULIN ASPART 100 UNIT/ML ~~LOC~~ SOLN
5.0000 [IU] | Freq: Once | SUBCUTANEOUS | Status: AC
  Administered 2016-10-20: 5 [IU] via SUBCUTANEOUS

## 2016-10-20 NOTE — Discharge Summary (Addendum)
Physician Discharge Summary  Philip [name] ZOX:[id number] DOB: 1950/03/22 DOA: 10/18/2016  PCP: Patient, No Pcp Per  Admit date: 10/18/2016 Discharge date: 10/20/2016  Admitted From:home Disposition:home  Recommendations for Outpatient Follow-up:  1. Follow up with PCP in 1-2 weeks 2. Please obtain BMP/CBC in one week 3. Please follow-up acute hepatitis panel test result   Home Health:no Equipment/Devices:none Discharge Condition:stable CODE STATUS:full code Diet recommendation:carb modified heart healthy diet  Brief/Interim Summary: 66 y.o.malewith medical history significant of HTN, DM type II, CAD s/p CABG; who presents after being found with on the floor by his grandson.patient was found to have blood sugar level 27.Patient notes being on metformin, Trulicity, and 70/30 insulin. He is self prescribes the dosage of 70/30 insulin,and  reported taking30 units subcutaneously twice daily.  # Hypoglycemia: likely due to self prescribing insulin.Patient was prescribed metformin and trulicity only by his PCP. He bought the insulin from [facility] because he wanted to control his blood sugar better. Education provided to the patient regarding not taking medication without consulting his PCP for DM management.  -Plan to continue metformin and trulicity for his diabetes management. I added NPH 5 units bid for the management of hyperglycemia. He was taking higher dose at home. He is A1c 6.7. Blood sugar level improved in the hospital. He is able to take orally. Discussed with diabetic coordinator.  #Acute metabolic encephalopathy in the setting of hypoglycemia. Mental status improved.  #Transaminitis: Unknown etiology.liver enzymes are improved today. Patient has no nausea vomiting or abdominal pain. Ultrasound of abdomen with possible liver cirrhosis. Patient reported remote history of drinking. Hepatitis panel pending. Recommended to follow up with PCP.  #Essential hypertension: Continue  amlodipine and metoprolol. Monitor blood pressure.  #CAD status post CABG: Continue Plavix and aspirin.  #Chronic kidney disease stage III: Monitor BMP. Avoid nephrotoxins.  Patient's blood sugar level improved. Mental status improved. He is eager to go home. Education provided to the patient regarding diabetes management. Recommended to follow up with PCP. He verbalized understanding. Drop in hemoglobin noted therefore ordered repeat CBC. Patient has no bleeding.  Discharge Diagnoses:  Principal Problem:   Hypoglycemia Active Problems:   Weight loss   CKD (chronic kidney disease), stage III (HCC)   Essential hypertension   CAD (coronary artery disease) of artery bypass graft   GERD (gastroesophageal reflux disease)   Transaminitis    Discharge Instructions  Discharge Instructions    Call MD for:  difficulty breathing, headache or visual disturbances    Complete by:  As directed    Call MD for:  extreme fatigue    Complete by:  As directed    Call MD for:  hives    Complete by:  As directed    Call MD for:  persistant dizziness or light-headedness    Complete by:  As directed    Call MD for:  persistant nausea and vomiting    Complete by:  As directed    Call MD for:  severe uncontrolled pain    Complete by:  As directed    Call MD for:  temperature >100.4    Complete by:  As directed    Diet - low sodium heart healthy    Complete by:  As directed    Diet Carb Modified    Complete by:  As directed    Increase activity slowly    Complete by:  As directed      Allergies as of 10/20/2016      Reactions  Codeine    GI upset      Medication List    TAKE these medications   amLODipine 5 MG tablet Commonly known as:  NORVASC Take 5 mg by mouth daily.   aspirin EC 81 MG tablet Take 81 mg by mouth daily.   calcium carbonate 500 MG chewable tablet Commonly known as:  TUMS - dosed in mg elemental calcium Chew 2 tablets by mouth as needed for indigestion or  heartburn.   citalopram 20 MG tablet Commonly known as:  CELEXA Take 20 mg by mouth daily.   clopidogrel 75 MG tablet Commonly known as:  PLAVIX Take 75 mg by mouth daily.   doxylamine (Sleep) 25 MG tablet Commonly known as:  UNISOM Take 25 mg by mouth at bedtime as needed for sleep.   esomeprazole 40 MG capsule Commonly known as:  NEXIUM Take 1 capsule (40 mg total) by mouth daily as needed (Heartburn). What changed:  medication strength  how much to take  when to take this   IMODIUM A-D PO Take 1 tablet by mouth as needed.   insulin NPH Human 100 UNIT/ML injection Commonly known as:  HUMULIN N Inject 0.05 mLs (5 Units total) into the skin 2 (two) times daily before a meal.   metFORMIN 1000 MG tablet Commonly known as:  GLUCOPHAGE Take 1,000 mg by mouth 2 (two) times daily with a meal.   metoprolol tartrate 25 MG tablet Commonly known as:  LOPRESSOR Take 25 mg by mouth 2 (two) times daily.   multivitamin with minerals Tabs tablet Take 1 tablet by mouth daily.   nitroGLYCERIN 0.4 MG/SPRAY spray Commonly known as:  NITROLINGUAL Place 1 spray under the tongue every 5 (five) minutes x 3 doses as needed for chest pain.   TRULICITY 0.75 MG/0.5ML Sopn Generic drug:  Dulaglutide Inject into the skin once a week.      Follow-up Information    [facility]. Schedule an appointment as soon as possible for a visit in 1 week(s).   Contact information: [address] [place] [address] [phone]         Allergies  Allergen Reactions  . Codeine     GI upset    Consultations: None  Procedures/Studies: None  Subjective: Seen and examined at bedside. Denied headache, dizziness, nausea vomiting chest pain shortness of breath.  Discharge Exam: Vitals:   10/20/16 1142 10/20/16 1521  BP: (!) 143/60 (!) 121/58  Pulse:  70  Resp:    Temp: 98.1 F (36.7 C) 98.5 F (36.9 C)  SpO2: 100% 100%    Vitals:   10/20/16 0423 10/20/16 0842 10/20/16 1142 10/20/16 1521  BP: 129/73 (!) 123/54 (!) 143/60 (!) 121/58  Pulse: 74   70  Resp: 18     Temp:  98.2 F (36.8 C) 98.1 F (36.7 C) 98.5 F (36.9 C)  TempSrc: Oral Oral Oral Oral  SpO2: 98% 97% 100% 100%  Weight:      Height:        General: Pt is alert, awake, not in acute distress Cardiovascular: RRR, S1/S2 +, no rubs, no gallops Respiratory: CTA bilaterally, no wheezing, no rhonchi Abdominal: Soft, NT, ND, bowel sounds + Extremities: no edema, no cyanosis    The results of significant diagnostics from this hospitalization (including imaging, microbiology, ancillary and laboratory) are listed below for reference.     Microbiology: No results found for this or any previous visit (from the past 240 hour(s)).  Labs: BNP (last 3 results) No results for input(s): BNP in the last 8760 hours. Basic Metabolic Panel:  Recent Labs Lab 10/18/16 1704 10/19/16 0407 10/20/16 0428  NA 139 137 137  K 3.9 4.6 4.6  CL 100* 103 102  CO2 GLUCOSE 57* 227* 337*  BUN CREATININE 1.29* 1.12 1.19  CALCIUM 9.3 8.1* 8.6*   Liver Function Tests:  Recent Labs Lab 10/18/16 1704 10/19/16 0407 10/20/16 0428  AST 31 90* 25  ALT 40 83* 49  ALKPHOS 195* 279* 231*  BILITOT 0.6 0.8 0.6  PROT 7.5 5.2* 5.9*  ALBUMIN 3.9 2.7* 2.9*   No results for input(s): LIPASE, AMYLASE in the last 168 hours. No results for input(s): AMMONIA in the last 168 hours. CBC:  Recent Labs Lab 10/18/16 1704 10/19/16 0407  WBC 8.6 7.9  NEUTROABS 6.0  --   HGB 15.2 11.3*  HCT 46.8 35.2*  MCV 91.1 89.3  PLT 284 222   Cardiac Enzymes: No results for input(s): CKTOTAL, CKMB, CKMBINDEX, TROPONINI in the last 168 hours. BNP: Invalid input(s): POCBNP CBG:  Recent Labs Lab 10/19/16 1734 10/19/16 2005 10/20/16 0422 10/20/16 0711 10/20/16 1130  GLUCAP 277* 346* 329* 303* 365*   D-Dimer No results for input(s): DDIMER in  the last 72 hours. Hgb A1c  Recent Labs  10/19/16 0407  HGBA1C 6.7*   Lipid Profile No results for input(s): CHOL, HDL, LDLCALC, TRIG, CHOLHDL, LDLDIRECT in the last 72 hours. Thyroid function studies  Recent Labs  10/18/16 1730  TSH 1.588   Anemia work up No results for input(s): VITAMINB12, FOLATE, FERRITIN, TIBC, IRON, RETICCTPCT in the last 72 hours. Urinalysis    Component Value Date/Time   COLORURINE YELLOW 10/18/2016 1927   APPEARANCEUR CLEAR 10/18/2016 1927   LABSPEC 1.025 10/18/2016 1927   PHURINE 5.0 10/18/2016 1927   GLUCOSEU 50 (A) 10/18/2016 1927   HGBUR NEGATIVE 10/18/2016 1927   BILIRUBINUR NEGATIVE 10/18/2016 1927   KETONESUR NEGATIVE 10/18/2016 1927   PROTEINUR 30 (A) 10/18/2016 1927   NITRITE NEGATIVE 10/18/2016 1927   LEUKOCYTESUR NEGATIVE 10/18/2016 1927   Sepsis Labs Invalid input(s): PROCALCITONIN,  WBC,  LACTICIDVEN Microbiology No results found for this or any previous visit (from the past 240 hour(s)).   Time coordinating discharge: 28 minutes  SIGNED:   [name], MD  [facility] 10/20/2016, 3:35 PM  If 7PM-7AM, please contact night-coverage [email or web address] Password [email or web address]

## 2016-10-20 NOTE — Progress Notes (Signed)
DM2Inpatient Diabetes Program Recommendations  AACE/ADA: New Consensus Statement on Inpatient Glycemic Control (2015)  Target Ranges:  Prepandial:   less than 140 mg/dL      Peak postprandial:   less than 180 mg/dL (1-2 hours)      Critically ill patients:  140 - 180 mg/dL   Lab Results  Component Value Date   GLUCAP 365 (H) 10/20/2016   HGBA1C 6.7 (H) 10/19/2016    Review of Glycemic Control  Diabetes history: DM2 Outpatient Diabetes medications: Trulicity .75 mg Q week, metformin 1000 mg bid, self-prescribed 70/30 30 units QAM (self dosing) Current orders for Inpatient glycemic control: None  Pt to be discharged. Per EMR, pt had purchased insulin from [facility] and had been dosing himself. Admitted with hypoglycemia of 37 mg/dL. Checks blood sugars bid. Weight loss of 30 pounds. No PCP since moving to [place].  Spoke to MD regarding small amount of insulin at home. May need small amount of insulin at home. Check blood sugars at least 4x/day and f/u with [facility] within 1 week.  ? C-peptide with 30# weight loss.  Will call pt after discharge to check on blood sugar control.  Thank you. Ailene Ards, RD, LDN, CDE Inpatient Diabetes Coordinator [phone]

## 2016-10-22 LAB — HEPATITIS PANEL, ACUTE
HEP B C IGM: NEGATIVE
HEP B S AG: NEGATIVE
Hep A IgM: NEGATIVE

## 2018-02-15 ENCOUNTER — Other Ambulatory Visit: Payer: Self-pay

## 2018-02-15 ENCOUNTER — Inpatient Hospital Stay (HOSPITAL_COMMUNITY)
Admission: EM | Admit: 2018-02-15 | Discharge: 2018-02-25 | DRG: 871 | Disposition: A | Discharge status: Skilled Nursing Facility | Payer: Medicare Other | Attending: Internal Medicine | Admitting: Internal Medicine

## 2018-02-15 ENCOUNTER — Encounter (HOSPITAL_COMMUNITY): Payer: Self-pay | Admitting: Emergency Medicine

## 2018-02-15 ENCOUNTER — Inpatient Hospital Stay (HOSPITAL_COMMUNITY): Payer: Medicare Other

## 2018-02-15 ENCOUNTER — Other Ambulatory Visit (HOSPITAL_COMMUNITY): Payer: Medicare Other

## 2018-02-15 ENCOUNTER — Emergency Department (HOSPITAL_COMMUNITY): Payer: Medicare Other

## 2018-02-15 DIAGNOSIS — R112 Nausea with vomiting, unspecified: Secondary | ICD-10-CM

## 2018-02-15 DIAGNOSIS — F419 Anxiety disorder, unspecified: Secondary | ICD-10-CM | POA: Diagnosis present

## 2018-02-15 DIAGNOSIS — E785 Hyperlipidemia, unspecified: Secondary | ICD-10-CM | POA: Diagnosis present

## 2018-02-15 DIAGNOSIS — R197 Diarrhea, unspecified: Secondary | ICD-10-CM | POA: Diagnosis not present

## 2018-02-15 DIAGNOSIS — E860 Dehydration: Secondary | ICD-10-CM

## 2018-02-15 DIAGNOSIS — R000 Tachycardia, unspecified: Secondary | ICD-10-CM

## 2018-02-15 DIAGNOSIS — Z951 Presence of aortocoronary bypass graft: Secondary | ICD-10-CM | POA: Diagnosis not present

## 2018-02-15 DIAGNOSIS — G9340 Encephalopathy, unspecified: Secondary | ICD-10-CM | POA: Diagnosis present

## 2018-02-15 DIAGNOSIS — Z9049 Acquired absence of other specified parts of digestive tract: Secondary | ICD-10-CM

## 2018-02-15 DIAGNOSIS — R579 Shock, unspecified: Secondary | ICD-10-CM | POA: Diagnosis present

## 2018-02-15 DIAGNOSIS — I69320 Aphasia following cerebral infarction: Secondary | ICD-10-CM | POA: Diagnosis not present

## 2018-02-15 DIAGNOSIS — I214 Non-ST elevation (NSTEMI) myocardial infarction: Secondary | ICD-10-CM | POA: Diagnosis present

## 2018-02-15 DIAGNOSIS — I129 Hypertensive chronic kidney disease with stage 1 through stage 4 chronic kidney disease, or unspecified chronic kidney disease: Secondary | ICD-10-CM | POA: Diagnosis not present

## 2018-02-15 DIAGNOSIS — E11649 Type 2 diabetes mellitus with hypoglycemia without coma: Secondary | ICD-10-CM | POA: Diagnosis present

## 2018-02-15 DIAGNOSIS — I472 Ventricular tachycardia: Secondary | ICD-10-CM | POA: Diagnosis present

## 2018-02-15 DIAGNOSIS — R0902 Hypoxemia: Secondary | ICD-10-CM | POA: Diagnosis present

## 2018-02-15 DIAGNOSIS — I5030 Unspecified diastolic (congestive) heart failure: Secondary | ICD-10-CM | POA: Diagnosis present

## 2018-02-15 DIAGNOSIS — E1122 Type 2 diabetes mellitus with diabetic chronic kidney disease: Secondary | ICD-10-CM | POA: Diagnosis present

## 2018-02-15 DIAGNOSIS — A419 Sepsis, unspecified organism: Principal | ICD-10-CM

## 2018-02-15 DIAGNOSIS — I2510 Atherosclerotic heart disease of native coronary artery without angina pectoris: Secondary | ICD-10-CM | POA: Diagnosis present

## 2018-02-15 DIAGNOSIS — D72829 Elevated white blood cell count, unspecified: Secondary | ICD-10-CM | POA: Diagnosis not present

## 2018-02-15 DIAGNOSIS — R6521 Severe sepsis with septic shock: Secondary | ICD-10-CM | POA: Diagnosis present

## 2018-02-15 DIAGNOSIS — Z7982 Long term (current) use of aspirin: Secondary | ICD-10-CM

## 2018-02-15 DIAGNOSIS — I130 Hypertensive heart and chronic kidney disease with heart failure and stage 1 through stage 4 chronic kidney disease, or unspecified chronic kidney disease: Secondary | ICD-10-CM | POA: Diagnosis present

## 2018-02-15 DIAGNOSIS — I69354 Hemiplegia and hemiparesis following cerebral infarction affecting left non-dominant side: Secondary | ICD-10-CM | POA: Diagnosis not present

## 2018-02-15 DIAGNOSIS — K7200 Acute and subacute hepatic failure without coma: Secondary | ICD-10-CM | POA: Diagnosis present

## 2018-02-15 DIAGNOSIS — Z800 Family history of malignant neoplasm of digestive organs: Secondary | ICD-10-CM

## 2018-02-15 DIAGNOSIS — R0600 Dyspnea, unspecified: Secondary | ICD-10-CM

## 2018-02-15 DIAGNOSIS — F0390 Unspecified dementia without behavioral disturbance: Secondary | ICD-10-CM | POA: Diagnosis not present

## 2018-02-15 DIAGNOSIS — I9589 Other hypotension: Secondary | ICD-10-CM | POA: Diagnosis not present

## 2018-02-15 DIAGNOSIS — Z7902 Long term (current) use of antithrombotics/antiplatelets: Secondary | ICD-10-CM

## 2018-02-15 DIAGNOSIS — E861 Hypovolemia: Secondary | ICD-10-CM

## 2018-02-15 DIAGNOSIS — J9601 Acute respiratory failure with hypoxia: Secondary | ICD-10-CM | POA: Diagnosis present

## 2018-02-15 DIAGNOSIS — I25810 Atherosclerosis of coronary artery bypass graft(s) without angina pectoris: Secondary | ICD-10-CM | POA: Diagnosis not present

## 2018-02-15 DIAGNOSIS — R7401 Elevation of levels of liver transaminase levels: Secondary | ICD-10-CM

## 2018-02-15 DIAGNOSIS — I4892 Unspecified atrial flutter: Secondary | ICD-10-CM | POA: Diagnosis present

## 2018-02-15 DIAGNOSIS — I493 Ventricular premature depolarization: Secondary | ICD-10-CM | POA: Diagnosis present

## 2018-02-15 DIAGNOSIS — R740 Nonspecific elevation of levels of transaminase and lactic acid dehydrogenase [LDH]: Secondary | ICD-10-CM | POA: Diagnosis not present

## 2018-02-15 DIAGNOSIS — I959 Hypotension, unspecified: Secondary | ICD-10-CM | POA: Diagnosis present

## 2018-02-15 DIAGNOSIS — I4891 Unspecified atrial fibrillation: Secondary | ICD-10-CM | POA: Diagnosis present

## 2018-02-15 DIAGNOSIS — R4702 Dysphasia: Secondary | ICD-10-CM | POA: Diagnosis present

## 2018-02-15 DIAGNOSIS — Z79899 Other long term (current) drug therapy: Secondary | ICD-10-CM

## 2018-02-15 DIAGNOSIS — N179 Acute kidney failure, unspecified: Secondary | ICD-10-CM | POA: Diagnosis present

## 2018-02-15 DIAGNOSIS — N183 Chronic kidney disease, stage 3 (moderate): Secondary | ICD-10-CM | POA: Diagnosis present

## 2018-02-15 DIAGNOSIS — R6520 Severe sepsis without septic shock: Secondary | ICD-10-CM

## 2018-02-15 DIAGNOSIS — J9690 Respiratory failure, unspecified, unspecified whether with hypoxia or hypercapnia: Secondary | ICD-10-CM

## 2018-02-15 DIAGNOSIS — N189 Chronic kidney disease, unspecified: Secondary | ICD-10-CM | POA: Diagnosis not present

## 2018-02-15 DIAGNOSIS — I471 Supraventricular tachycardia: Secondary | ICD-10-CM | POA: Diagnosis present

## 2018-02-15 DIAGNOSIS — Z8673 Personal history of transient ischemic attack (TIA), and cerebral infarction without residual deficits: Secondary | ICD-10-CM | POA: Diagnosis not present

## 2018-02-15 DIAGNOSIS — Z794 Long term (current) use of insulin: Secondary | ICD-10-CM

## 2018-02-15 DIAGNOSIS — R7989 Other specified abnormal findings of blood chemistry: Secondary | ICD-10-CM | POA: Diagnosis not present

## 2018-02-15 DIAGNOSIS — Z885 Allergy status to narcotic agent status: Secondary | ICD-10-CM

## 2018-02-15 LAB — TROPONIN I: Troponin I: <0.03 ng/mL (ref <0.03)

## 2018-02-15 LAB — ECHOCARDIOGRAM COMPLETE

## 2018-02-15 LAB — URINALYSIS, ROUTINE W REFLEX MICROSCOPIC
Bilirubin Urine: NEGATIVE
Glucose, UA: NEGATIVE mg/dL
Hgb urine dipstick: NEGATIVE
KETONES UR: NEGATIVE mg/dL
LEUKOCYTES UA: NEGATIVE
NITRITE: NEGATIVE
PH: 5 (ref 5.0–8.0)
Protein, ur: NEGATIVE mg/dL
SPECIFIC GRAVITY, URINE: 1.019 (ref 1.005–1.030)

## 2018-02-15 LAB — CBC WITH DIFFERENTIAL/PLATELET
ABS IMMATURE GRANULOCYTES: 0.06 10*3/uL (ref 0.00–0.07)
BASOS ABS: 0 10*3/uL (ref 0.0–0.1)
Basophils Relative: 0 %
Eosinophils Absolute: 0.3 10*3/uL (ref 0.0–0.5)
Eosinophils Relative: 3 %
HEMATOCRIT: 45 % (ref 39.0–52.0)
HEMOGLOBIN: 13.9 g/dL (ref 13.0–17.0)
IMMATURE GRANULOCYTES: 1 %
LYMPHS ABS: 0.3 10*3/uL — AB (ref 0.7–4.0)
LYMPHS PCT: 2 %
MCH: 29.3 pg (ref 26.0–34.0)
MCHC: 30.9 g/dL (ref 30.0–36.0)
MCV: 94.7 fL (ref 80.0–100.0)
MONOS PCT: 8 %
Monocytes Absolute: 0.8 10*3/uL (ref 0.1–1.0)
NEUTROS PCT: 86 %
NRBC: 0 % (ref 0.0–0.2)
Neutro Abs: 9.4 10*3/uL — ABNORMAL HIGH (ref 1.7–7.7)
Platelets: 191 10*3/uL (ref 150–400)
RBC: 4.75 MIL/uL (ref 4.22–5.81)
RDW: 13.9 % (ref 11.5–15.5)
WBC: 10.9 10*3/uL — ABNORMAL HIGH (ref 4.0–10.5)

## 2018-02-15 LAB — COMPREHENSIVE METABOLIC PANEL
ALBUMIN: 3.5 g/dL (ref 3.5–5.0)
ALK PHOS: 85 U/L (ref 38–126)
ALT: 104 U/L — AB (ref 0–44)
ALT: 76 U/L — ABNORMAL HIGH (ref 0–44)
AST: 132 U/L — AB (ref 15–41)
AST: 52 U/L — ABNORMAL HIGH (ref 15–41)
Albumin: 2.8 g/dL — ABNORMAL LOW (ref 3.5–5.0)
Alkaline Phosphatase: 58 U/L (ref 38–126)
Anion gap: 15 (ref 5–15)
Anion gap: 10 (ref 5–15)
BILIRUBIN TOTAL: 1.2 mg/dL (ref 0.3–1.2)
BUN: 24 mg/dL — ABNORMAL HIGH (ref 8–23)
BUN: 25 mg/dL — ABNORMAL HIGH (ref 8–23)
CHLORIDE: 105 mmol/L (ref 98–111)
CO2: 22 mmol/L (ref 22–32)
CO2: 23 mmol/L (ref 22–32)
Calcium: 9.1 mg/dL (ref 8.9–10.3)
Calcium: 7.8 mg/dL — ABNORMAL LOW (ref 8.9–10.3)
Chloride: 107 mmol/L (ref 98–111)
Creatinine, Ser: 1.7 mg/dL — ABNORMAL HIGH (ref 0.61–1.24)
Creatinine, Ser: 1.7 mg/dL — ABNORMAL HIGH (ref 0.61–1.24)
GFR calc Af Amer: 47 mL/min — ABNORMAL LOW (ref >60)
GFR calc Af Amer: 47 mL/min — ABNORMAL LOW (ref >60)
GFR calc non Af Amer: 40 mL/min — ABNORMAL LOW (ref >60)
GFR, EST NON AFRICAN AMERICAN: 40 mL/min — AB (ref >60)
GLUCOSE: 135 mg/dL — AB (ref 70–99)
Glucose, Bld: 146 mg/dL — ABNORMAL HIGH (ref 70–99)
Potassium: 4.6 mmol/L (ref 3.5–5.1)
Potassium: 4.8 mmol/L (ref 3.5–5.1)
SODIUM: 140 mmol/L (ref 135–145)
Sodium: 142 mmol/L (ref 135–145)
Total Bilirubin: 0.9 mg/dL (ref 0.3–1.2)
Total Protein: 6.7 g/dL (ref 6.5–8.1)
Total Protein: 5.7 g/dL — ABNORMAL LOW (ref 6.5–8.1)

## 2018-02-15 LAB — RESPIRATORY PANEL BY PCR
ADENOVIRUS-RVPPCR: NOT DETECTED
Bordetella pertussis: NOT DETECTED
CORONAVIRUS 229E-RVPPCR: NOT DETECTED
Chlamydophila pneumoniae: NOT DETECTED
Coronavirus HKU1: NOT DETECTED
Coronavirus NL63: NOT DETECTED
Coronavirus OC43: NOT DETECTED
Influenza A: NOT DETECTED
Influenza B: NOT DETECTED
Metapneumovirus: NOT DETECTED
Mycoplasma pneumoniae: NOT DETECTED
Parainfluenza Virus 1: NOT DETECTED
Parainfluenza Virus 2: NOT DETECTED
Parainfluenza Virus 3: NOT DETECTED
Parainfluenza Virus 4: NOT DETECTED
Respiratory Syncytial Virus: NOT DETECTED
Rhinovirus / Enterovirus: NOT DETECTED

## 2018-02-15 LAB — LACTIC ACID, PLASMA
Lactic Acid, Venous: 3.2 mmol/L (ref 0.5–1.9)
Lactic Acid, Venous: 1.6 mmol/L (ref 0.5–1.9)

## 2018-02-15 LAB — STREP PNEUMONIAE URINARY ANTIGEN: Strep Pneumo Urinary Antigen: NEGATIVE

## 2018-02-15 LAB — INFLUENZA PANEL BY PCR (TYPE A & B)
Influenza A By PCR: NEGATIVE
Influenza B By PCR: NEGATIVE

## 2018-02-15 LAB — CBG MONITORING, ED
GLUCOSE-CAPILLARY: 138 mg/dL — AB (ref 70–99)
Glucose-Capillary: 136 mg/dL — ABNORMAL HIGH (ref 70–99)
Glucose-Capillary: 128 mg/dL — ABNORMAL HIGH (ref 70–99)
Glucose-Capillary: 168 mg/dL — ABNORMAL HIGH (ref 70–99)
Glucose-Capillary: 189 mg/dL — ABNORMAL HIGH (ref 70–99)
Glucose-Capillary: 163 mg/dL — ABNORMAL HIGH (ref 70–99)

## 2018-02-15 LAB — BRAIN NATRIURETIC PEPTIDE: B Natriuretic Peptide: 124.5 pg/mL — ABNORMAL HIGH (ref 0.0–100.0)

## 2018-02-15 LAB — LACTATE DEHYDROGENASE: LDH: 161 U/L (ref 98–192)

## 2018-02-15 MED ORDER — ACETAMINOPHEN 650 MG RE SUPP: 650.0000 mg | Freq: Four times a day (QID) | RECTAL | Status: DC | PRN

## 2018-02-15 MED ORDER — CLOPIDOGREL BISULFATE 75 MG PO TABS
75.0000 mg | ORAL_TABLET | Freq: Every day | ORAL | Status: DC
  Administered 2018-02-16 – 2018-02-25 (×10): 75 mg via ORAL
  Filled 2018-02-15 (×10): qty 1

## 2018-02-15 MED ORDER — ASPIRIN EC 81 MG PO TBEC
81.0000 mg | DELAYED_RELEASE_TABLET | Freq: Every day | ORAL | Status: DC
  Administered 2018-02-16 – 2018-02-25 (×10): 81 mg via ORAL
  Filled 2018-02-15 (×10): qty 1

## 2018-02-15 MED ORDER — SODIUM CHLORIDE 0.9 % IV BOLUS
1000.0000 mL | Freq: Once | INTRAVENOUS | Status: AC
Start: 2018-02-15 — End: 2018-02-15
  Administered 2018-02-15: 1000 mL via INTRAVENOUS

## 2018-02-15 MED ORDER — DIPHENHYDRAMINE HCL 50 MG/ML IJ SOLN
25.0000 mg | Freq: Once | INTRAMUSCULAR | Status: AC
  Administered 2018-02-15: 25 mg via INTRAVENOUS
  Filled 2018-02-15: qty 1

## 2018-02-15 MED ORDER — SODIUM CHLORIDE 0.9 % IV BOLUS
1000.0000 mL | Freq: Once | INTRAVENOUS | Status: AC
  Administered 2018-02-15: 1000 mL via INTRAVENOUS

## 2018-02-15 MED ORDER — PERFLUTREN LIPID MICROSPHERE
1.0000 mL | INTRAVENOUS | Status: AC | PRN
  Administered 2018-02-15 (×2): 2 mL via INTRAVENOUS
  Filled 2018-02-15: qty 10

## 2018-02-15 MED ORDER — INSULIN ASPART 100 UNIT/ML ~~LOC~~ SOLN
0.0000 [IU] | Freq: Three times a day (TID) | SUBCUTANEOUS | Status: DC
  Administered 2018-02-15: 1 [IU] via SUBCUTANEOUS
  Administered 2018-02-15: 2 [IU] via SUBCUTANEOUS
  Administered 2018-02-16: 5 [IU] via SUBCUTANEOUS
  Administered 2018-02-16: 2 [IU] via SUBCUTANEOUS
  Administered 2018-02-16 – 2018-02-17 (×3): 5 [IU] via SUBCUTANEOUS
  Administered 2018-02-17: 3 [IU] via SUBCUTANEOUS

## 2018-02-15 MED ORDER — METOCLOPRAMIDE HCL 5 MG/ML IJ SOLN
10.0000 mg | Freq: Once | INTRAMUSCULAR | Status: AC
  Administered 2018-02-15: 10 mg via INTRAVENOUS
  Filled 2018-02-15: qty 2

## 2018-02-15 MED ORDER — ACETAMINOPHEN 325 MG PO TABS
650.0000 mg | ORAL_TABLET | Freq: Four times a day (QID) | ORAL | Status: DC | PRN
  Administered 2018-02-15: 650 mg via ORAL
  Filled 2018-02-15: qty 2

## 2018-02-15 MED ORDER — PIPERACILLIN-TAZOBACTAM 3.375 G IVPB
3.3750 g | Freq: Three times a day (TID) | INTRAVENOUS | Status: DC
  Administered 2018-02-15 – 2018-02-17 (×7): 3.375 g via INTRAVENOUS
  Filled 2018-02-15 (×6): qty 50

## 2018-02-15 MED ORDER — INSULIN ASPART 100 UNIT/ML ~~LOC~~ SOLN
0.0000 [IU] | Freq: Every day | SUBCUTANEOUS | Status: DC
  Administered 2018-02-16: 2 [IU] via SUBCUTANEOUS

## 2018-02-15 MED ORDER — SODIUM CHLORIDE 0.9 % IV SOLN
500.0000 mg | INTRAVENOUS | Status: DC
  Administered 2018-02-15 – 2018-02-18 (×4): 500 mg via INTRAVENOUS
  Filled 2018-02-15 (×5): qty 500

## 2018-02-15 MED ORDER — CITALOPRAM HYDROBROMIDE 20 MG PO TABS
20.0000 mg | ORAL_TABLET | Freq: Every day | ORAL | Status: DC
  Administered 2018-02-16 – 2018-02-25 (×10): 20 mg via ORAL
  Filled 2018-02-15 (×10): qty 1

## 2018-02-15 MED ORDER — ENOXAPARIN SODIUM 40 MG/0.4ML ~~LOC~~ SOLN
40.0000 mg | SUBCUTANEOUS | Status: DC
  Administered 2018-02-15 – 2018-02-16 (×2): 40 mg via SUBCUTANEOUS
  Filled 2018-02-15 (×2): qty 0.4

## 2018-02-15 MED ORDER — SODIUM CHLORIDE 0.9 % IV SOLN: 1.0000 g | INTRAVENOUS | Status: DC

## 2018-02-15 MED ORDER — SODIUM CHLORIDE 0.9 % IV SOLN
1.0000 g | INTRAVENOUS | Status: DC
  Administered 2018-02-15: 1 g via INTRAVENOUS
  Filled 2018-02-15: qty 10

## 2018-02-15 NOTE — ED Notes (Signed)
Spoke with Dr. Frances Furbish in regards to increased [facility] and possibly bruising that was noted around the right eye and recommended a Ct scan ; per dr. Frances Furbish , no ct scan right now ; no further orders received ; will continue to monitor patient

## 2018-02-15 NOTE — Progress Notes (Signed)
  Echocardiogram 2D Echocardiogram has been performed.  Philip [name] 02/15/2018, 12:06 PM

## 2018-02-15 NOTE — ED Notes (Signed)
Spoke with judy ( ex wife ) to obtain a baseline mental status ; Darel Hong states she still talks to him on a daily basis ; she states that the patient usually has some " memory issues"  She also stated that even though he has some memory problems he is able to recall his DOB and the year

## 2018-02-15 NOTE — Consult Note (Addendum)
NAME:  Philip RidgeHelmut R Deadwyler [name], MRN:  [id number], DOB:  08/14/1950, LOS: 0 ADMISSION DATE:  02/15/2018, CONSULTATION DATE: 02/15/2018 REFERRING MD: Debe CoderEmily Mullen, MD, CHIEF COMPLAINT: Hypotension, altered mental status  Brief History   68 year old with hypertension, diabetes, CKD, coronary artery disease admitted with vomiting, diarrhea for the past 2 days.  Received 3 L of IV fluids in ED but continues to have low blood pressure.  Appears more confused.  PCCM consulted for evaluation.  History of present illness     Past Medical History  CKD stage III, hypertension, coronary artery disease status post CABG, GERD  Significant Hospital Events   2/9 Admit   Consults:  PCCM  Procedures:    Significant Diagnostic Tests:    Micro Data:    Antimicrobials:    Interim history/subjective:    Objective   Blood pressure (!) 96/50, pulse 81, resp. rate 17, SpO2 92 %.       No intake or output data in the 24 hours ending 02/15/18 0833 There were no vitals filed for this visit.  Examination: Blood pressure 114/67, pulse 79, resp. rate 12, SpO2 95 %. Gen:      No acute distress HEENT:  EOMI, sclera anicteric Neck:     No masses; no thyromegaly Lungs:    Clear to auscultation bilaterally; normal respiratory effort CV:         Regular rate and rhythm; no murmurs Abd:      + bowel sounds; soft, non-tender; no palpable masses, no distension Ext:    No edema; adequate peripheral perfusion Skin:      Warm and dry; no rash Neuro: alert and oriented x 3 Psych: normal mood and affect  Resolved Hospital Problem list     Assessment & Plan:  68 year old with hypertension in the setting of nausea, vomiting, possible gastroenteritis CCM: For persistent hypertension  Hypotension, sepsis BP appears adequate now Elevated LA noted.  Follow repeat levels No need for pressors at present unless lactic acid is worsening Agree with empiric ceftriaxone, azithromycin Follow blood cultures, flu PCR,  virus panel  Acute kidney injury Follow urine output and creatinine  Elevated LFTs Likely shock liver Follow comprehensive metabolic panel  Altered mental status and encephalopathy Appears confused with no focal deficits Consider head CT if there is no improvement.  Best practice:  Diet: NPO Pain/Anxiety/Delirium protocol (if indicated): NA VAP protocol (if indicated): NA DVT prophylaxis: NA GI prophylaxis: NA Glucose control: SSI coverage Mobility: Bed Code Status: Full Family Communication: No family at bediside Disposition: SDU  Labs   CBC: Recent Labs  Lab 02/15/18 0230  WBC 10.9*  NEUTROABS 9.4*  HGB 13.9  HCT 45.0  MCV 94.7  PLT 191    Basic Metabolic Panel: Recent Labs  Lab 02/15/18 0230  NA 142  K 4.6  CL 105  CO2 22  GLUCOSE 135*  BUN 24*  CREATININE 1.70*  CALCIUM 9.1   GFR: CrCl cannot be calculated (Unknown ideal weight.). Recent Labs  Lab 02/15/18 0230  WBC 10.9*    Liver Function Tests: Recent Labs  Lab 02/15/18 0230  AST 132*  ALT 104*  ALKPHOS 85  BILITOT 1.2  PROT 6.7  ALBUMIN 3.5   No results for input(s): LIPASE, AMYLASE in the last 168 hours. No results for input(s): AMMONIA in the last 168 hours.  ABG No results found for: PHART, PCO2ART, PO2ART, HCO3, TCO2, ACIDBASEDEF, O2SAT   Coagulation Profile: No results for input(s): INR, PROTIME in the last 168  hours.  Cardiac Enzymes: No results for input(s): CKTOTAL, CKMB, CKMBINDEX, TROPONINI in the last 168 hours.  HbA1C: Hgb A1c MFr Bld  Date/Time Value Ref Range Status  10/19/2016 04:07 AM 6.7 (H) 4.8 - 5.6 % Final    Comment:    (NOTE) Pre diabetes:          5.7%-6.4% Diabetes:              >6.4% Glycemic control for   <7.0% adults with diabetes     CBG: Recent Labs  Lab 02/15/18 0158 02/15/18 0738  GLUCAP 136* 128*    Review of Systems:   Unable to obtain due to altered mental status.  Past Medical History  He,  has a past medical history of  Diabetes mellitus (HCC).   Surgical History    Past Surgical History:  Procedure Laterality Date  . CORONARY ARTERY BYPASS GRAFT       Social History   reports that he has never smoked. He has never used smokeless tobacco. He reports that he does not drink alcohol or use drugs.   Family History   His family history includes Colon cancer in his father.   Allergies Allergies  Allergen Reactions  . Codeine     GI upset     Home Medications  Prior to Admission medications   Medication Sig Start Date End Date Taking? Authorizing Provider  amLODipine (NORVASC) 5 MG tablet Take 5 mg by mouth daily.   Yes [provider]  aspirin EC 81 MG tablet Take 81 mg by mouth daily.   Yes [provider]  calcium carbonate (TUMS - DOSED IN MG ELEMENTAL CALCIUM) 500 MG chewable tablet Chew 2 tablets by mouth as needed for indigestion or heartburn.   Yes [provider]  citalopram (CELEXA) 20 MG tablet Take 20 mg by mouth daily.   Yes [provider]  clopidogrel (PLAVIX) 75 MG tablet Take 75 mg by mouth daily.   Yes [provider]  insulin NPH Human (HUMULIN N) 100 UNIT/ML injection Inject 0.05 mLs (5 Units total) into the skin 2 (two) times daily before a meal. Patient taking differently: Inject 20 Units into the skin 2 (two) times daily before a meal.  10/20/16  Yes Maxie BarbBhandari, Dron Prasad, MD  metFORMIN (GLUCOPHAGE) 1000 MG tablet Take 1,000 mg by mouth 2 (two) times daily with a meal.   Yes [provider]  metoprolol tartrate (LOPRESSOR) 25 MG tablet Take 25 mg by mouth 2 (two) times daily.   Yes [provider]  nitroGLYCERIN (NITROLINGUAL) 0.4 MG/SPRAY spray Place 1 spray under the tongue every 5 (five) minutes x 3 doses as needed for chest pain.   Yes [provider]  esomeprazole (NEXIUM) 40 MG capsule Take 1 capsule (40 mg total) by mouth daily as needed (Heartburn). Patient not taking: Reported on 02/15/2018 10/20/16    Maxie BarbBhandari, Dron Prasad, MD  Multiple Vitamin (MULTIVITAMIN WITH MINERALS) TABS tablet Take 1 tablet by mouth daily. Patient not taking: Reported on 02/15/2018 10/21/16   Maxie BarbBhandari, Dron Prasad, MD   Chilton GreathousePraveen Ramir Malerba MD [facility] Pager [phone] If no answer call [phone] 02/15/2018, 9:40 AM

## 2018-02-15 NOTE — ED Notes (Signed)
CBG For verification purposes patient was unable to only state last name and birth day but not year.  Right eye of patient has a bluish color bruising.

## 2018-02-15 NOTE — ED Notes (Signed)
Darel Hong, patient's significant other advised she was going to go home, she wanted to leave her number to be reached once we know if patient is getting discharged or admitted: [phone]

## 2018-02-15 NOTE — ED Notes (Signed)
Ordered a hospital bed--Jered Heiny  

## 2018-02-15 NOTE — ED Provider Notes (Signed)
Notified about pt's confusion.  Pt seen by the prior ED attending and pt has been admitted.  He has no focal deficits on my exam but is confused.  Having some word finding difficulties.  Unclear if this is new or not.   I will notify the admitting team.    Linwood Dibbles, MD 02/17/18 1202

## 2018-02-15 NOTE — ED Notes (Signed)
Bladder scan showed 712 ml.

## 2018-02-15 NOTE — ED Notes (Addendum)
Paging Dr. Frances FurbishWinfrey

## 2018-02-15 NOTE — ED Notes (Signed)
Daughter Aram Beecham Prins [phone]

## 2018-02-15 NOTE — H&P (Addendum)
Date: 02/15/2018               Patient Name:  Philip RidgeHelmut R [name] [name] MRN: [id number]  DOB: [birth year] Age / Sex: 68 y.o., male   PCP: System, Pcp Not In         Medical Service: Internal Medicine Teaching Service         Attending Physician: Dr. Inez CatalinaMullen, Emily B, MD    First Contact: Dr. Gwyneth RevelsKrienke Pager: [phone]  Second Contact: Dr. 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 EMS noted a systolic BP of 70 and CBG of 69, he was given 500 cc of NS and D10 with improvement in BP and blood glucose. No family at bedside. Patient is alert and oriented to place however was unable to answer what year it was or what his full name was. He does report that he had been having diarrhea for a few days, denies any abdominal pain. He states that he has been able to take his home medications with no issues. Per the wife the patient often has some waxing and waning confusion but typically knows his name and DOB. On our evaluation patient was having difficulty finding words or recalling information.   Per wife, last night he started throwing up, became lethargic and couldn't remember anything, he got upset when they called ambulance. She reports that he did not complain of any other symptoms such as abdominal pain, productive cough, or urinary issues. She does report that he has a dry cough, and has been having lower back pain x1 month. She denied any sick contacts and reports that he never had this happen before. Her grandson normally looks after him. She reports that he normally can carry on a conversation with her. He has had issues with diarrhea and he  takes imodium for this. BP is around normal when he is on his medications and he last took them yesterday. She reports that he was going to make her POA however has not done this yet.   In the ED patient was noted to have a normal RR and HR, he had hypotension down to 70/56 which improved slightly to 100's/50s. CBGs have been in the 128. CMP showed an AKI to 1.7, increase in AST and ALT to 132 and 104 respectively. CBC showed a leukocytosis to 10.9. He received 2L and was getting another 1L bolus while we examined him and his BP was around 95/65 with a MAP of 61.    Meds:  Current Meds  Medication Sig  . amLODipine (NORVASC) 5 MG tablet Take 5 mg by mouth daily.  Marland Kitchen. aspirin EC 81 MG tablet Take 81 mg by mouth daily.  . calcium carbonate (TUMS - DOSED IN MG ELEMENTAL CALCIUM) 500 MG chewable tablet Chew 2 tablets by mouth as needed for indigestion or heartburn.  . citalopram (CELEXA) 20 MG tablet Take 20 mg by mouth daily.  . clopidogrel (PLAVIX) 75 MG tablet Take 75 mg by mouth daily.  . insulin NPH Human (HUMULIN N) 100 UNIT/ML injection Inject 0.05 mLs (5 Units total) into  the skin 2 (two) times daily before a meal. (Patient taking differently: Inject 20 Units into the skin 2 (two) times daily before a meal. )  . metFORMIN (GLUCOPHAGE) 1000 MG tablet Take 1,000 mg by mouth 2 (two) times daily with a meal.  . metoprolol tartrate (LOPRESSOR) 25 MG tablet Take 25 mg by mouth 2 (two) times daily.  . nitroGLYCERIN (NITROLINGUAL) 0.4 MG/SPRAY spray Place 1 spray under the tongue every 5 (five) minutes x 3 doses as needed for chest pain.     Allergies: Allergies as of 02/15/2018 - Review Complete 02/15/2018  Allergen Reaction Noted  . Codeine  10/18/2016   Past Medical History:  Diagnosis Date  . Diabetes mellitus (HCC)     Family History:  Family History  Problem Relation Age of Onset  . Colon cancer Father     Social History:  Social History   Socioeconomic History  . Marital  status: Legally Separated    Spouse name: Not on file  . Number of children: Not on file  . Years of education: Not on file  . Highest education level: Not on file  Occupational History  . Not on file  Social Needs  . Financial resource strain: Not on file  . Food insecurity:    Worry: Not on file    Inability: Not on file  . Transportation needs:    Medical: Not on file    Non-medical: Not on file  Tobacco Use  . Smoking status: Never Smoker  . Smokeless tobacco: Never Used  Substance and Sexual Activity  . Alcohol use: No  . Drug use: No  . Sexual activity: Never  Lifestyle  . Physical activity:    Days per week: Not on file    Minutes per session: Not on file  . Stress: Not on file  Relationships  . Social connections:    Talks on phone: Not on file    Gets together: Not on file    Attends religious service: Not on file    Active member of club or organization: Not on file    Attends meetings of clubs or organizations: Not on file    Relationship status: Not on file  . Intimate partner violence:    Fear of current or ex partner: Not on file    Emotionally abused: Not on file    Physically abused: Not on file    Forced sexual activity: Not on file  Other Topics Concern  . Not on file  Social History Narrative   Patient and the grandchild he was raising recently moved in with his daughter (grandson's mom) after his health began to decline     Review of Systems: A complete ROS was negative except as per HPI.   Physical Exam: Blood pressure (!) 104/52, pulse 80, resp. rate 18, SpO2 92 %. Physical Exam  Constitutional:  NAD, resting comfortably  HENT:  Head: Normocephalic and atraumatic.  Eyes: Pupils are equal, round, and reactive to light. Conjunctivae and EOM are normal.  Neck: Normal range of motion. Neck supple.  Cardiovascular: Normal rate, regular rhythm and normal heart sounds.  Pulmonary/Chest: Effort normal and breath sounds normal. No respiratory  distress.  Midline surgical scar  Abdominal: Soft. Bowel sounds are normal. He exhibits no distension.  Musculoskeletal: Normal range of motion.        General: No edema.  Neurological: He is alert. No cranial nerve deficit.  Skin: Skin is warm and dry. He is not diaphoretic.  Psychiatric:  Poor memory, word finding difficulty, intermittent confusion    Assessment & Plan by Problem: Active Problems:   * No active hospital problems. *  Severe hypotension 2/2 emesis: Acute encephalopathy 2/2 possible shock, either hypovolemic vs septic: -Per the wife he started vomiting last night, became lethargic, and was having more confusion. No abdominal pain, sick contacts, productive cough, or issues with urination. Patient has baseline confusion but wife reports that is is normally aware of the date and his name, he appeared to have worsening confusion today. He received 2L NS and was getting another 1L and his SBPs remained in the low 100s, given the poor response to fluid resuscitation it is likely that he will require vasopressor support. He was found to have a leukocytosis, will assess for septic source of the shock. CXR showed cardiomegaly and mild interstitial edema, no consolidation seen.  -Will consult PCCM for possible vasopressor need -Lactic acid -Blood cultures x2 -EKG and troponin  -Start ceftriaxone and azithromycin -Influenza panel -Tylenol PRN for fevers -Respiratory panel, legionella and strep pneumoniae urianry antigen -U/A with reflex culture -Imodium for diarrhea  AKI: -Likely 2/2 dehydration, Ct increased to 1.7, base line around 1.1. Patient is receiving fluids at this time. He has not had any urinary since he has been here.  -Recheck CMP in AM  Elevated transaminase: -AST 132, ALT 104. Given his initial presention there is a concern for shock liver. Will hold off on hepatitis screening at this time however if this does not improve can consider checking this.  -CMP in  AM  H/o CAD s/p CABG: -On plavix and ASA at home, patient reported that he had been taking this.  -Check troponin and EKG -Continue Plavix and ASA   Diabetes: -On insulin and metformin at home. Has a history of hypoglycemia however CBG on admission was 69.  -SSI -Frequent CBGs  FEN: NS Bolus, replete lytes prn, NPO VTE ppx: Lovenox  Code Status: FULL   Dispo: Admit patient to Inpatient with expected length of stay greater than 2 midnights.  Signed: Claudean Severance, MD 02/15/2018, 8:45 AM  Pager: [phone]

## 2018-02-15 NOTE — ED Triage Notes (Signed)
Pt bib [facility] from home d/t diarrhea x 2 days. N/V x 24 hours.  Initial BP was 70 palp.  562ml's NS given w/ improvement in BP. Initial CBG 69 D10 IV given w/ improvement to 118. Pt given 8 mg IV zofran w/o relief of nausea.

## 2018-02-15 NOTE — ED Notes (Signed)
Notified Dr. Lynelle Doctor of patient's neuro status and Dr. Lynelle Doctor at bedside evaluating pt ; no further orders received at this time

## 2018-02-15 NOTE — ED Notes (Signed)
Meal delivered, pt states he is not hungry at this time

## 2018-02-15 NOTE — ED Provider Notes (Signed)
[name] EMERGENCY DEPARTMENT Provider Note   CSN: [id number] Arrival date & time: 02/15/18  0150     History   Chief Complaint Chief Complaint  Patient presents with  . Hypotension    HPI Philip [name] is a 68 y.o. male.  The history is provided by the patient.  He has history of hypertension, diabetes, chronic kidney disease, coronary artery disease who complains of vomiting and diarrhea for the last 2 days and is brought in by ambulance.  EMS reported initial blood pressure of 70 and CBG of 69.  He was given 500 mL of normal saline and D10 with improvement in blood pressure and blood glucose.  He was given ondansetron intravenously which has not relieved his nausea.  He had taken 4 doses of loperamide during the course of the day which has not slowed his diarrhea.  He denies fever, chills, sweats.  He denies any abdominal pain.  He denies any sick contacts.  Past Medical History:  Diagnosis Date  . Diabetes mellitus Philip [name])     Patient Active Problem List   Diagnosis Date Noted  . Weight loss 10/19/2016  . CKD (chronic kidney disease), stage III (HCC) 10/19/2016  . Essential hypertension 10/19/2016  . CAD (coronary artery disease) of artery bypass graft 10/19/2016  . GERD (gastroesophageal reflux disease) 10/19/2016  . Transaminitis   . Hypoglycemia 10/18/2016    Past Surgical History:  Procedure Laterality Date  . CORONARY ARTERY BYPASS GRAFT          Home Medications    Prior to Admission medications   Medication Sig Start Date End Date Taking? Authorizing Provider  amLODipine (NORVASC) 5 MG tablet Take 5 mg by mouth daily.    [provider]  aspirin EC 81 MG tablet Take 81 mg by mouth daily.    [provider]  calcium carbonate (TUMS - DOSED IN MG ELEMENTAL CALCIUM) 500 MG chewable tablet Chew 2 tablets by mouth as needed for indigestion or heartburn.    [provider]  citalopram (CELEXA) 20 MG tablet Take 20  mg by mouth daily.    [provider]  clopidogrel (PLAVIX) 75 MG tablet Take 75 mg by mouth daily.    [provider]  doxylamine, Sleep, (UNISOM) 25 MG tablet Take 25 mg by mouth at bedtime as needed for sleep.    [provider]  Dulaglutide (TRULICITY) 0.75 MG/0.5ML SOPN Inject into the skin once a week.    [provider]  esomeprazole (NEXIUM) 40 MG capsule Take 1 capsule (40 mg total) by mouth daily as needed (Heartburn). 10/20/16   Philip Barb, MD  insulin NPH Human (HUMULIN N) 100 UNIT/ML injection Inject 0.05 mLs (5 Units total) into the skin 2 (two) times daily before a meal. 10/20/16   Philip Barb, MD  Loperamide HCl (IMODIUM A-D PO) Take 1 tablet by mouth as needed.    [provider]  metFORMIN (GLUCOPHAGE) 1000 MG tablet Take 1,000 mg by mouth 2 (two) times daily with a meal.    [provider]  metoprolol tartrate (LOPRESSOR) 25 MG tablet Take 25 mg by mouth 2 (two) times daily.    [provider]  Multiple Vitamin (MULTIVITAMIN WITH MINERALS) TABS tablet Take 1 tablet by mouth daily. 10/21/16   Philip Barb, MD  nitroGLYCERIN (NITROLINGUAL) 0.4 MG/SPRAY spray Place 1 spray under the tongue every 5 (five) minutes x 3 doses as needed for chest pain.  [provider]    Family History Family History  Problem Relation Age of Onset  . Colon cancer Father     Social History Social History   Tobacco Use  . Smoking status: Never Smoker  . Smokeless tobacco: Never Used  Substance Use Topics  . Alcohol use: No  . Drug use: No     Allergies   Codeine   Review of Systems Review of Systems  All other systems reviewed and are negative.    Physical Exam Updated Vital Signs BP (!) 113/58   Pulse 64   Resp 18   SpO2 93%   Physical Exam Vitals signs and nursing note reviewed.    68 year old male, resting comfortably and in no acute distress. Vital signs are normal.  Oxygen saturation is 93%, which is normal. Head is normocephalic and atraumatic. PERRLA, EOMI. Oropharynx is clear. Neck is nontender and supple without adenopathy or JVD. Back is nontender and there is no CVA tenderness. Lungs are clear without rales, wheezes, or rhonchi. Chest is nontender. Heart has regular rate and rhythm without murmur. Abdomen is soft, flat, nontender without masses or hepatosplenomegaly and peristalsis is normoactive. Extremities have no cyanosis or edema, full range of motion is present. Skin is warm and dry without rash. Neurologic: Mental status is normal, cranial nerves are intact, there are no motor or sensory deficits.  ED Treatments / Results  Labs (all labs ordered are listed, but only abnormal results are displayed) Labs Reviewed  COMPREHENSIVE METABOLIC PANEL - Abnormal; Notable for the following components:      Result Value   Glucose, Bld 135 (*)    BUN 24 (*)    Creatinine, Ser 1.70 (*)    AST 132 (*)    ALT 104 (*)    GFR calc non Af Amer 40 (*)    GFR calc Af Amer 47 (*)    All other components within normal limits  CBC WITH DIFFERENTIAL/PLATELET - Abnormal; Notable for the following components:   WBC 10.9 (*)    Neutro Abs 9.4 (*)    Lymphs Abs 0.3 (*)    All other components within normal limits  CBG MONITORING, ED - Abnormal; Notable for the following components:   Glucose-Capillary 136 (*)    All other components within normal limits  URINALYSIS, ROUTINE W REFLEX MICROSCOPIC   Procedures Procedures  CRITICAL CARE Performed by: Dione Booze Total critical care time: 40 minutes Critical care time was exclusive of separately billable procedures and treating other patients. Critical care was necessary to treat or prevent imminent or life-threatening deterioration. Critical care was time spent personally by me on the following activities: development of treatment plan with patient and/or surrogate as well as nursing, discussions with  consultants, evaluation of patient's response to treatment, examination of patient, obtaining history from patient or surrogate, ordering and performing treatments and interventions, ordering and review of laboratory studies, ordering and review of radiographic studies, pulse oximetry and re-evaluation of patient's condition.   Medications Ordered in ED Medications  sodium chloride 0.9 % bolus 1,000 mL (has no administration in time range)  sodium chloride 0.9 % bolus 1,000 mL (0 mLs Intravenous Stopped 02/15/18 0401)  metoCLOPramide (REGLAN) injection 10 mg (10 mg Intravenous Given 02/15/18 0219)  diphenhydrAMINE (BENADRYL) injection 25 mg (25 mg Intravenous Given 02/15/18 0220)  sodium chloride 0.9 % bolus 1,000 mL (0 mLs Intravenous Stopped 02/15/18 0537)     Initial Impression / Assessment and Plan / ED Course  I have reviewed the triage vital signs and the nursing notes.  Pertinent labs & imaging results that were available during my care of the patient were reviewed by me and considered in my medical decision making (see chart for details).  Vomiting and diarrhea with dehydration and hypotension.  Blood pressure is adequate in the ED, but he will be given additional IV fluid.  Because of failure to respond to ondansetron, he will be given intravenous metoclopramide.  Screening labs are obtained.  Old records were reviewed, and he has no relevant past visits.  Labs show evidence of acute kidney injury with creatinine 1.7 and baseline 1.1.  Also, mild elevation of transaminases is present of uncertain significance.  He had good relief of nausea with metoclopramide, but blood pressure remains somewhat low.  He is given a second liter of saline and blood pressure still remains low and even has dropped into the 90s.  Additional IV fluid is ordered.  Given failure to respond to reasonable fluid replacement and the presence of acute kidney injury, decision is made to admit the patient for further  hydration.  Case is discussed with Dr. Frances FurbishWinfrey of internal medicine teaching service who agrees to admit the patient.  Final Clinical Impressions(s) / ED Diagnoses   Final diagnoses:  Nausea vomiting and diarrhea  Acute kidney injury (nontraumatic) (HCC)  Dehydration  Elevated transaminase level    ED Discharge Orders    None       Dione BoozeGlick, Odies Desa, MD 02/15/18 [id number]

## 2018-02-16 ENCOUNTER — Inpatient Hospital Stay (HOSPITAL_COMMUNITY): Payer: Medicare Other

## 2018-02-16 DIAGNOSIS — R7989 Other specified abnormal findings of blood chemistry: Secondary | ICD-10-CM

## 2018-02-16 DIAGNOSIS — J9601 Acute respiratory failure with hypoxia: Secondary | ICD-10-CM

## 2018-02-16 DIAGNOSIS — R6520 Severe sepsis without septic shock: Secondary | ICD-10-CM | POA: Diagnosis present

## 2018-02-16 DIAGNOSIS — R6521 Severe sepsis with septic shock: Secondary | ICD-10-CM

## 2018-02-16 DIAGNOSIS — A419 Sepsis, unspecified organism: Principal | ICD-10-CM

## 2018-02-16 DIAGNOSIS — E11649 Type 2 diabetes mellitus with hypoglycemia without coma: Secondary | ICD-10-CM

## 2018-02-16 LAB — BLOOD GAS, ARTERIAL
ACID-BASE DEFICIT: 3.4 mmol/L — AB (ref 0.0–2.0)
Bicarbonate: 21 mmol/L (ref 20.0–28.0)
Drawn by: 347621
FIO2: 100
O2 Saturation: 98.1 %
PH ART: 7.37 (ref 7.350–7.450)
Patient temperature: 98.6
pCO2 arterial: 37.2 mmHg (ref 32.0–48.0)
pO2, Arterial: 220 mmHg — ABNORMAL HIGH (ref 83.0–108.0)

## 2018-02-16 LAB — COMPREHENSIVE METABOLIC PANEL
ALT: 62 U/L — ABNORMAL HIGH (ref 0–44)
AST: 38 U/L (ref 15–41)
Albumin: 2.9 g/dL — ABNORMAL LOW (ref 3.5–5.0)
Alkaline Phosphatase: 59 U/L (ref 38–126)
Anion gap: 10 (ref 5–15)
BUN: 24 mg/dL — ABNORMAL HIGH (ref 8–23)
CO2: 23 mmol/L (ref 22–32)
Calcium: 7.7 mg/dL — ABNORMAL LOW (ref 8.9–10.3)
Chloride: 105 mmol/L (ref 98–111)
Creatinine, Ser: 1.83 mg/dL — ABNORMAL HIGH (ref 0.61–1.24)
GFR calc Af Amer: 43 mL/min — ABNORMAL LOW (ref >60)
GFR calc non Af Amer: 37 mL/min — ABNORMAL LOW (ref >60)
Glucose, Bld: 201 mg/dL — ABNORMAL HIGH (ref 70–99)
Potassium: 4.2 mmol/L (ref 3.5–5.1)
Sodium: 138 mmol/L (ref 135–145)
Total Bilirubin: 1.3 mg/dL — ABNORMAL HIGH (ref 0.3–1.2)
Total Protein: 5.9 g/dL — ABNORMAL LOW (ref 6.5–8.1)

## 2018-02-16 LAB — LEGIONELLA PNEUMOPHILA SEROGP 1 UR AG: L. pneumophila Serogp 1 Ur Ag: NEGATIVE

## 2018-02-16 LAB — CBC
HEMATOCRIT: 33.6 % — AB (ref 39.0–52.0)
Hemoglobin: 10.9 g/dL — ABNORMAL LOW (ref 13.0–17.0)
MCH: 30.2 pg (ref 26.0–34.0)
MCHC: 32.4 g/dL (ref 30.0–36.0)
MCV: 93.1 fL (ref 80.0–100.0)
Platelets: 155 10*3/uL (ref 150–400)
RBC: 3.61 MIL/uL — ABNORMAL LOW (ref 4.22–5.81)
RDW: 14.2 % (ref 11.5–15.5)
WBC: 11.4 10*3/uL — ABNORMAL HIGH (ref 4.0–10.5)
nRBC: 0 % (ref 0.0–0.2)

## 2018-02-16 LAB — GLUCOSE, CAPILLARY
Glucose-Capillary: 212 mg/dL — ABNORMAL HIGH (ref 70–99)
Glucose-Capillary: 251 mg/dL — ABNORMAL HIGH (ref 70–99)
Glucose-Capillary: 280 mg/dL — ABNORMAL HIGH (ref 70–99)
Glucose-Capillary: 182 mg/dL — ABNORMAL HIGH (ref 70–99)
Glucose-Capillary: 217 mg/dL — ABNORMAL HIGH (ref 70–99)

## 2018-02-16 LAB — LACTIC ACID, PLASMA
Lactic Acid, Venous: 3.3 mmol/L (ref 0.5–1.9)
Lactic Acid, Venous: 2.1 mmol/L (ref 0.5–1.9)
Lactic Acid, Venous: 1.6 mmol/L (ref 0.5–1.9)

## 2018-02-16 LAB — TROPONIN I
Troponin I: 0.98 ng/mL (ref <0.03)
Troponin I: 2.38 ng/mL (ref <0.03)
Troponin I: 5.24 ng/mL (ref <0.03)

## 2018-02-16 LAB — HIV ANTIBODY (ROUTINE TESTING W REFLEX): HIV Screen 4th Generation wRfx: NONREACTIVE

## 2018-02-16 LAB — MRSA PCR SCREENING: MRSA by PCR: POSITIVE — AB

## 2018-02-16 MED ORDER — HEPARIN (PORCINE) 25000 UT/250ML-% IV SOLN
950.0000 [IU]/h | INTRAVENOUS | Status: DC
  Administered 2018-02-16: 950 [IU]/h via INTRAVENOUS
  Filled 2018-02-16: qty 250

## 2018-02-16 MED ORDER — IOHEXOL 300 MG/ML  SOLN
100.0000 mL | Freq: Once | INTRAMUSCULAR | Status: AC | PRN
  Administered 2018-02-16: 75 mL via INTRAVENOUS

## 2018-02-16 MED ORDER — HEPARIN BOLUS VIA INFUSION
4000.0000 [IU] | Freq: Once | INTRAVENOUS | Status: AC
  Administered 2018-02-16: 4000 [IU] via INTRAVENOUS
  Filled 2018-02-16: qty 4000

## 2018-02-16 MED ORDER — INSULIN GLARGINE 100 UNIT/ML ~~LOC~~ SOLN
6.0000 [IU] | Freq: Every day | SUBCUTANEOUS | Status: DC
  Administered 2018-02-16: 6 [IU] via SUBCUTANEOUS
  Filled 2018-02-16: qty 0.06

## 2018-02-16 MED ORDER — METOPROLOL TARTRATE 5 MG/5ML IV SOLN
2.5000 mg | Freq: Once | INTRAVENOUS | Status: AC
  Administered 2018-02-16: 2.5 mg via INTRAVENOUS
  Filled 2018-02-16: qty 5

## 2018-02-16 MED ORDER — MUPIROCIN 2 % EX OINT
1.0000 "application " | TOPICAL_OINTMENT | Freq: Two times a day (BID) | CUTANEOUS | Status: AC
  Administered 2018-02-16 – 2018-02-20 (×10): 1 via NASAL
  Filled 2018-02-16 (×2): qty 22

## 2018-02-16 MED ORDER — ONDANSETRON HCL 4 MG/2ML IJ SOLN
4.0000 mg | Freq: Once | INTRAMUSCULAR | Status: AC
  Administered 2018-02-16: 4 mg via INTRAVENOUS
  Filled 2018-02-16: qty 2

## 2018-02-16 MED ORDER — FUROSEMIDE 10 MG/ML IJ SOLN
20.0000 mg | Freq: Once | INTRAMUSCULAR | Status: AC
  Administered 2018-02-16: 20 mg via INTRAVENOUS
  Filled 2018-02-16: qty 2

## 2018-02-16 MED ORDER — ORAL CARE MOUTH RINSE
15.0000 mL | Freq: Two times a day (BID) | OROMUCOSAL | Status: DC
  Administered 2018-02-16 – 2018-02-25 (×16): 15 mL via OROMUCOSAL

## 2018-02-16 MED ORDER — CHLORHEXIDINE GLUCONATE CLOTH 2 % EX PADS
6.0000 | MEDICATED_PAD | Freq: Every day | CUTANEOUS | Status: AC
  Administered 2018-02-16 – 2018-02-20 (×5): 6 via TOPICAL

## 2018-02-16 MED ORDER — FUROSEMIDE 10 MG/ML IJ SOLN
40.0000 mg | Freq: Once | INTRAMUSCULAR | Status: AC
  Administered 2018-02-16: 40 mg via INTRAVENOUS
  Filled 2018-02-16: qty 4

## 2018-02-16 NOTE — Progress Notes (Signed)
NURSING PROGRESS NOTE  Giovonni Zaugg Kernan [name] [id number] Admission Data: 02/16/2018 7:03 AM Attending Provider: Inez Catalina, MD EHO:[id number], Pcp Not In Code Status: Full   Indio R Stanke [name]. is a 68 y.o. male patient admitted from ED:  -No acute distress noted.  -No complaints of shortness of breath.  -No complaints of chest pain.   Cardiac Monitoring: PC monitor M04 in place. Cardiac monitor yields:normal sinus rhythm.  Blood pressure 110/61, pulse 89, temperature 99.0 F (36.9 C), temperature source Oral, resp. rate (!) 27, weight 78.9 kg, SpO2 91 % on 2L [place]  IV Fluids:  IV in place, occlusive dsg intact without redness, IV cath hand right, condition patent and no redness none.   Allergies:  Codeine  Past Medical History:   has a past medical history of Diabetes mellitus (HCC).  Past Surgical History:   has a past surgical history that includes Coronary artery bypass graft.  Skin: bruising to right face, excoriation/scratch marks to bilateral lower legs  Patient is disoriented x 4 and unable to answer admission questions at this time. Resting comfortably in bed. Able to follow commands. Side rails x 2 up. No family at bedside at time of admission.

## 2018-02-16 NOTE — Significant Event (Signed)
Rapid Response Event Note Nursing [place] called for acute hypoxia (Sats 67%)  Overview: Time Called: 0532 Arrival Time: 0536 Event Type: Respiratory  Initial Focused Assessment: Upon arrival, pt is drowsy, disoriented x4 (not new). Pt does not appear to be in any respiratory distress but he does answer yes when asked if he is having trouble breathing.  BBS CTA and no accessory muscle use. Pt is now on NRB at 15L with sats 100%. HR 104 SR, BP 133/69, RR 18 and temp 98.5. Skin is pale, diaphoretic and warm. Dr. Dortha Schwalbe was notified and came to the bedside.  Interventions: -NRB prior to my arrival -Stat 12 Lead EKG -Stat ABG -Wean to HFNC after ABG results  Plan of Care (if not transferred): -Wean oxygen for sats > 92% -Call primary svc and/or RRRN for any further clinical decompensations  Event Summary: 0640- HR 103, RR 20 and sats 96% on [place] at 6L  Name of Physician Notified: Dr Evie Lacks at [phone]    at    Outcome: Stayed in room and stabalized  Event End Time: 0640  Rose Fillers

## 2018-02-16 NOTE — Progress Notes (Signed)
Patient had 13 beat run of V tach and 18 beat run of Vtach. Patient stated he felt something when physically assessing he just cannot find the words to articulate. TAking vitals and calling MD

## 2018-02-16 NOTE — Progress Notes (Signed)
Subjective: Per nursing, Philip [name] had an episode of diaphoresis and a drop in his oxygen saturation to the 80s for which he was put on a NRB mask. He has been weaned down to 5L [place]. This morning, Philip [name] reports that he feels short of breath as if he's been exerting himself. He also feels like he can't speak normally and is still having difficulty with his memory. He denies chest pain, abdominal pain, nausea, vomiting. He has not had any episodes of diarrhea while he's been in the hospital. He apologizes for having difficulty recalling information and communicating how he's feeling to the team.   Later during rounds we were page about patient being in sustained SVT, HR in the 130-140s, patietn became diaphoretic and was placed back on non-rebreather. Repeat EKG showed sinus tachycardia with no new findings.  Objective:  Vital signs in last 24 hours: Vitals:   02/16/18 0424 02/16/18 0500 02/16/18 0513 02/16/18 0535  BP:   115/82 133/69  Pulse:   (!) 134 (!) 120  Resp:   (!) 23 (!) 26  Temp: 98.1 F (36.7 C)   98.5 F (36.9 C)  TempSrc:    Oral  SpO2:   (!) 89% 98%  Weight:  78.9 kg      General: Lying comfortably in bed, no distress Cardiac: Tachycardic around 108, regular rhythm Pulmonary: CTAB, saturating 91% on 5L O2 Abdomen: Soft, minimal tenderness in RUQ to palpation, +BS Extremity: Erythematous excoriations on the left anterior shin  Assessment/Plan:  Active Problems:   Elevated transaminase level   Shock (HCC)  This is a 68 year old male with a history of CAD s/p CABG, MCA CVA, DMII, HTN, and CKD who presented with a 1 day history of n/v, diarrhea, confusion and lethargy. Found to be hypotensive with a SBP of 70 and hypoglycemic with a CBG of 69. He was found to be febrile to 101, and hypotensive. He was fluid resuscitated however SBPs still remained low. Started on zosyn and azithromycin.  Severe hypotension 2/2 emesis: Shock, likely septic 2/2 unknown source: -He is  still hypotensive, but improved from admission, MAPs >65. Abdominal u/s was unremarkable. Urinalysis was unremarkable. CXR showed bilateral edema but no areas of consolidation. Blood cultures are still pending. He is currently on zosyn and azithromycin, he has remained afebrile since admission however his leukocytosis is still present. Lactic acid initially improved to 1.6 however today it increased to 3.3. On exam he is still having confusion and lethargy, extremities are warm, cardiac and pulmonary exam were unremarkable. He did have some tenderness in the RUQ area. It is concerning that he is still hypotensive and had poor response to fluid resuscitation. Since he is still having a leukocytosis and elevated lactic acid we will continue to look for a source of infection.   -RSV negative, strep pneumonia urinary antigen negative.  -Continue zosyn and azithromycin -F/u blood cultures -Abdominal CT scan today -Continue [place], wean as tolerated -Continue to trend lactic acid -Tylenol PRN for fevers -Imodium for diarrhea  SVT: -Patient has missed his beta-blocker medications and he has been fluid resuscitated and started on biotics for septic shock.  This may be multifactorial with him missing his home medications and him having some fluid overload. EKG showed sinus tachycardia with no acute changes.  -Metoprolol 2.5 IV once -Lasix 40 mg IV once -Continue telemetry -May restart low dose of metoprolol depending on his blood pressure.   AKI: -Still present, patient has remained hypotensive while  here with minimal response to fluid resuscitation. It's sill possible that this can be pre-renal in nature.  -Continue to follow CMP  Elevated transaminase: -Improved today, will continue to monitor.   H/o CAD s/p CABG: -On plavix and ASA at home, patient reported that he had been taking this.  -Continue Plavix and ASA   Diabetes: -On insulin and metformin at home. Has a history of hypoglycemia however  CBG on admission was 69.CBGs  More elevated today.  -Continue SSI -Frequent CBGs  FEN: No fluids, replete lytes prn, NPO VTE ppx: Lovenox  Code Status: FULL    Dispo: Anticipated discharge is pending clinical improvement.   Philip [name], Philip Heitman M, MD 02/16/2018, 6:44 AM Pager: [phone]

## 2018-02-16 NOTE — Progress Notes (Signed)
CRITICAL VALUE ALERT  Critical Value:  Troponin 0.98  Date & Time Notied:  02/16/18 @ 1052  Provider Notified: MD paged   Orders Received/Actions taken: Will await any new orders

## 2018-02-16 NOTE — Progress Notes (Signed)
RN notified of patient's HR sustaining in the 130s by CCMD. RN noticed patient was anxious and O2 sats were in the low 90s-high 80s. Patient on 2L of O2 [place]. RN adjusted patient to an upright position with back support. CBG 212. O2 sats continued to drop on [place]. Venturi mask placed and O2 sats dropped to 67%. RN requested NRB, placed on patient at 15L. O2 sats started to increase. RT and RR RN notified and arrived to bedside. On-call physician Agyei notified and arrived to bedside. New orders received and implemented. NRB was removed and patient was placed on a HFNC at 6L with hydration and maintaining O2 sats at 96%. Patient oriented to self. Upon admission to unit patient disoriented x 4. Patient monitoring will continue.

## 2018-02-16 NOTE — Progress Notes (Signed)
  Date: 02/16/2018  Patient name: Epifanio Duchateau Lamb [name].  Medical record number: [id number]  Date of birth: 02/13/1950   I have seen and evaluated this patient and I have discussed the plan of care with the house [facility]. Please see Dr. Sylvester Harder note for complete details. I concur with her findings and plan.  Mr. Rockford has been seemingly worse this morning with episodic tachycardia, SVT, NSVT and hypoxic episodes.  He is confused, but at the same level as yesterday.  Presumed sepsis, infectious source not yet uncovered.  PCCM is following.  I think he may need ICU care today if he does not turn the corner.  We will do a thorough skin review today to make sure we are not missing a source of infection.   Inez Catalina, MD 02/16/2018, 1:14 PM

## 2018-02-16 NOTE — Progress Notes (Signed)
NAME:  Philip RidgeHelmut R Vanlanen [name], MRN:  [id number], DOB:  30-Aug-1950, LOS: 1 ADMISSION DATE:  02/15/2018, CONSULTATION DATE: 02/15/2018 REFERRING MD: Debe CoderEmily Mullen, MD, CHIEF COMPLAINT: Hypotension, altered mental status  Brief History   68 year old with hypertension, diabetes, CKD, coronary artery disease admitted with vomiting, diarrhea for the past 2 days.  Received 3 L of IV fluids in ED but continues to have low blood pressure.  Appears more confused.  PCCM consulted for evaluation.  History of present illness     Past Medical History  CKD stage III, hypertension, coronary artery disease status post CABG, GERD  Significant Hospital Events   2/9 Admit   Consults:  PCCM  Procedures:    Significant Diagnostic Tests:  Chest x-ray 02/16/2018 appears to have mild edema small effusion  Micro Data:  Viral panel is negative 02/15/2018 blood cultures x2  Antimicrobials:   Zithromax 02/15/2018 Zosyn 02/15/2018  Interim history/subjective:   68 year old male is confused and a poor historian.  Continue to have periods of hypotension tachycardia.  His lactic acid is become elevated 3.3 Objective   Blood pressure 102/87, pulse 95, temperature 97.8 F (36.6 C), temperature source Oral, resp. rate (!) 22, weight 78.9 kg, SpO2 92 %.        Intake/Output Summary (Last 24 hours) at 02/16/2018 1202 Last data filed at 02/16/2018 1144 Gross per 24 hour  Intake -  Output 1525 ml  Net -1525 ml   Filed Weights   02/16/18 0038 02/16/18 0500  Weight: 78.9 kg 78.9 kg    Examination: General: Confused male in no acute distress at rest but appears pale HEENT: No JVD lymphadenopathy is appreciated Neuro: Confused as to time and place. CV: Heart sounds are regular without murmur he is tachycardic at 118 PULM: Decreased air movement.  Currently on O2 with O2 saturations at 92% NW:GNFAGI:soft, non-tender, bsx4 active  Extremities: warm/dry, 1+ edema  Skin: Pale   Resolved Hospital Problem list      Assessment & Plan:  68 year old with hypertension in the setting of nausea, vomiting, possible gastroenteritis CCM: For persistent hypotension  Hypotension, sepsis Currently normotensive on 02/16/2018 Denies lactic acid has gone from 1.6-3.3 He is not requiring pressors but his lactate is going up Continue empirical antibiotics Culture data monitor  Acute kidney injury Lab Results  Component Value Date   CREATININE 1.83 (H) 02/16/2018   CREATININE 1.70 (H) 02/15/2018   CREATININE 1.70 (H) 02/15/2018    Follow urine output and creatinine Gentle hydration  Elevated LFTs LFTs are normalizing on 02/16/2018 Continue you to follow  Altered mental status and encephalopathy Continues to be confused as to time and place he does not reorientate when spoken to forcibly  consider head CT as he remains confused UDS is positive for opiates but he does not use narcotics at home.  Global: He is having periods of hypotension.  Lactic acid has become more elevated 3.3.  Rapid response was called surgery last night for tachycardia and low blood pressure.  Need to continue to monitor he may need to go to the intensive care unit.  Best practice:  Diet: NPO Pain/Anxiety/Delirium protocol (if indicated): NA VAP protocol (if indicated): NA DVT prophylaxis: NA GI prophylaxis: NA Glucose control: SSI coverage Mobility: Bed Code Status: Full Family Communication: 02/16/2018 no family at bedside, patient is confused difficult to communicate with. Disposition: SDU  Labs   CBC: Recent Labs  Lab 02/15/18 0230 02/16/18 0340  WBC 10.9* 11.4*  NEUTROABS 9.4*  --  HGB 13.9 10.9*  HCT 45.0 33.6*  MCV 94.7 93.1  PLT 191 155    Basic Metabolic Panel: Recent Labs  Lab 02/15/18 0230 02/15/18 1727 02/16/18 0340  NA 142 140 138  K 4.6 4.8 4.2  CL 105 107 105  CO2 22 23 23   GLUCOSE 135* 146* 201*  BUN 24* 25* 24*  CREATININE 1.70* 1.70* 1.83*  CALCIUM 9.1 7.8* 7.7*   GFR: CrCl  cannot be calculated (Unknown ideal weight.). Recent Labs  Lab 02/15/18 0230 02/15/18 0850 02/15/18 1202 02/16/18 0340 02/16/18 0947  WBC 10.9*  --   --  11.4*  --   LATICACIDVEN  --  3.2* 1.6  --  3.3*   Lactic Acid, Venous    Component Value Date/Time   LATICACIDVEN 3.3 (HH) 02/16/2018 0947    Liver Function Tests: Recent Labs  Lab 02/15/18 0230 02/15/18 1727 02/16/18 0340  AST 132* 52* 38  ALT 104* 76* 62*  ALKPHOS 85 58 59  BILITOT 1.2 0.9 1.3*  PROT 6.7 5.7* 5.9*  ALBUMIN 3.5 2.8* 2.9*   No results for input(s): LIPASE, AMYLASE in the last 168 hours. No results for input(s): AMMONIA in the last 168 hours.  ABG    Component Value Date/Time   PHART 7.370 02/16/2018 0603   PCO2ART 37.2 02/16/2018 0603   PO2ART 220 (H) 02/16/2018 0603   HCO3 21.0 02/16/2018 0603   ACIDBASEDEF 3.4 (H) 02/16/2018 0603   O2SAT 98.1 02/16/2018 0603     Coagulation Profile: No results for input(s): INR, PROTIME in the last 168 hours.  Cardiac Enzymes: Recent Labs  Lab 02/15/18 0850 02/16/18 0943  TROPONINI <0.03 0.98*    HbA1C: Hgb A1c MFr Bld  Date/Time Value Ref Range Status  10/19/2016 04:07 AM 6.7 (H) 4.8 - 5.6 % Final    Comment:    (NOTE) Pre diabetes:          5.7%-6.4% Diabetes:              >6.4% Glycemic control for   <7.0% adults with diabetes     CBG: Recent Labs  Lab 02/15/18 1727 02/15/18 2214 02/16/18 0531 02/16/18 0758 02/16/18 1134  GLUCAP 138* 163* 212* 251* 280*     Steve Minor ACNP [facility] PCCM Pager [phone] till 1 pm If no answer page [phone] 02/16/2018, 12:02 PM

## 2018-02-16 NOTE — Consult Note (Signed)
Cardiology Consultation Note    Patient ID: Philip RidgeHelmut R Schreiter [name], MRN: [id number], DOB/AGE: [birth year] 68 y.o. Admit date: 02/15/2018   Date of Consult: 02/16/2018 Primary Physician: System, Pcp Not In  Reason for Consultation: Elevated troponin Requesting MD: Dr. Dortha SchwalbeAgyei  HPI: Philip RidgeHelmut R Meuser [name] is a 68 y.o. male with a history of coronary artery disease status post CABG, PAD, prior stroke, diabetes, who [facility] with nausea, vomiting, diarrhea and fever.  He was significantly altered upon presentation was found to be hypotensive to the 70s.  He is being treated for sepsis with an unclear source; he is on broad-spectrum antibiotics and required several units of aggressive IV fluid resuscitation to maintain his MAPs.  Patient has not had any complaints of chest pain and has not been significantly short of breath, although he did have episodic desaturations earlier today.  An echo was checked and showed preserved ejection fraction of 50 to 55%, with akinesis of the apical segments.  Troponins have been trended and have risen from 1.03 upon presentation to 5.4 as of this evening; he was started on IV heparin.  Upon my interview, the patient denies any acute complaints and specifically denies chest pain now or anytime recently.  Past Medical History:  Diagnosis Date  . Diabetes mellitus [facility](HCC)       Surgical History:  Past Surgical History:  Procedure Laterality Date  . CORONARY ARTERY BYPASS GRAFT       Home Meds: Prior to Admission medications   Medication Sig Start Date End Date Taking? Authorizing Provider  amLODipine (NORVASC) 5 MG tablet Take 5 mg by mouth daily.   Yes [provider]  aspirin EC 81 MG tablet Take 81 mg by mouth daily.   Yes [provider]  calcium carbonate (TUMS - DOSED IN MG ELEMENTAL CALCIUM) 500 MG chewable tablet Chew 2 tablets by mouth as needed for indigestion or heartburn.   Yes [provider]    citalopram (CELEXA) 20 MG tablet Take 20 mg by mouth daily.   Yes [provider]  clopidogrel (PLAVIX) 75 MG tablet Take 75 mg by mouth daily.   Yes [provider]  insulin NPH Human (HUMULIN N) 100 UNIT/ML injection Inject 0.05 mLs (5 Units total) into the skin 2 (two) times daily before a meal. Patient taking differently: Inject 20 Units into the skin 2 (two) times daily before a meal.  10/20/16  Yes Maxie BarbBhandari, Dron Prasad, MD  metFORMIN (GLUCOPHAGE) 1000 MG tablet Take 1,000 mg by mouth 2 (two) times daily with a meal.   Yes [provider]  metoprolol tartrate (LOPRESSOR) 25 MG tablet Take 25 mg by mouth 2 (two) times daily.   Yes [provider]  nitroGLYCERIN (NITROLINGUAL) 0.4 MG/SPRAY spray Place 1 spray under the tongue every 5 (five) minutes x 3 doses as needed for chest pain.   Yes [provider]  esomeprazole (NEXIUM) 40 MG capsule Take 1 capsule (40 mg total) by mouth daily as needed (Heartburn). Patient not taking: Reported on 02/15/2018 10/20/16   Maxie BarbBhandari, Dron Prasad, MD  Multiple Vitamin (MULTIVITAMIN WITH MINERALS) TABS tablet Take 1 tablet by mouth daily. Patient not taking: Reported on 02/15/2018 10/21/16   Maxie BarbBhandari, Dron Prasad, MD    Inpatient Medications:  . aspirin EC  81 mg Oral Daily  . Chlorhexidine Gluconate Cloth  6 each Topical Q0600  . citalopram  20 mg Oral Daily  . clopidogrel  75 mg  Oral Daily  . insulin aspart  0-5 Units Subcutaneous QHS  . insulin aspart  0-9 Units Subcutaneous TID WC  . insulin glargine  6 Units Subcutaneous QHS  . mouth rinse  15 mL Mouth Rinse BID  . mupirocin ointment  1 application Nasal BID   . azithromycin (ZITHROMAX) 500 MG IVPB (Vial-Mate [employer]) Stopped (02/16/18 1859)  . heparin 950 Units/hr (02/16/18 1836)  . piperacillin-tazobactam (ZOSYN)  IV 3.375 g (02/16/18 2215)    Allergies:  Allergies  Allergen Reactions  . Codeine     GI upset    Social History   Socioeconomic  History  . Marital status: Legally Separated    Spouse name: Not on file  . Number of children: Not on file  . Years of education: Not on file  . Highest education level: Not on file  Occupational History  . Not on file  Social Needs  . Financial resource strain: Not on file  . Food insecurity:    Worry: Not on file    Inability: Not on file  . Transportation needs:    Medical: Not on file    Non-medical: Not on file  Tobacco Use  . Smoking status: Never Smoker  . Smokeless tobacco: Never Used  Substance and Sexual Activity  . Alcohol use: No  . Drug use: No  . Sexual activity: Never  Lifestyle  . Physical activity:    Days per week: Not on file    Minutes per session: Not on file  . Stress: Not on file  Relationships  . Social connections:    Talks on phone: Not on file    Gets together: Not on file    Attends religious service: Not on file    Active member of club or organization: Not on file    Attends meetings of clubs or organizations: Not on file    Relationship status: Not on file  . Intimate partner violence:    Fear of current or ex partner: Not on file    Emotionally abused: Not on file    Physically abused: Not on file    Forced sexual activity: Not on file  Other Topics Concern  . Not on file  Social History Narrative   Patient and the grandchild he was raising recently moved in with his daughter (grandson's mom) after his health began to decline     Family History  Problem Relation Age of Onset  . Colon cancer Father      Review of Systems: All other systems reviewed and are otherwise negative except as noted above.  Labs: Recent Labs    02/15/18 0850 02/16/18 0943 02/16/18 1529 02/16/18 1915  TROPONINI <0.03 0.98* 2.38* 5.24*   Lab Results  Component Value Date   WBC 11.4 (H) 02/16/2018   HGB 10.9 (L) 02/16/2018   HCT 33.6 (L) 02/16/2018   MCV 93.1 02/16/2018   PLT 155 02/16/2018    Recent Labs  Lab 02/16/18 0340  NA 138  K 4.2   CL 105  CO2 23  BUN 24*  CREATININE 1.83*  CALCIUM 7.7*  PROT 5.9*  BILITOT 1.3*  ALKPHOS 59  ALT 62*  AST 38  GLUCOSE 201*   No results found for: CHOL, HDL, LDLCALC, TRIG No results found for: DDIMER  Radiology/Studies:  US Abdomen Complete  Result Date: 02/15/2018 CLINICAL DATA:  Transaminitis, status post cholecystectomy EXAM: ABDOMEN ULTRASOUND COMPLETE COMPARISON:  None. FINDINGS: Gallbladder: Surgically absent. Common bile duct: Diameter: 5 mm Liver:  Hyperechoic hepatic parenchyma. No focal hepatic lesion is seen. Mild intrahepatic ductal dilatation is possible. Portal vein is patent on color Doppler imaging with normal direction of blood flow towards the liver. IVC: No abnormality visualized. Pancreas: Poorly visualized/obscured by bowel gas. Spleen: Size and appearance within normal limits. Right Kidney: Length: 9.6 cm. 10 x 9 x 11 mm interpolar cyst. No hydronephrosis. Left Kidney: Length: 10.9 cm.  No mass or hydronephrosis. Abdominal aorta: No aneurysm visualized. Other findings: None. IMPRESSION: Hyperechoic hepatic parenchyma, nonspecific but raising the possibility of hepatic steatosis. Possible mild intrahepatic ductal dilatation. However, the common duct is nondilated. No focal hepatic lesion is evident on ultrasound. Electronically Signed   By: Charline Bills M.D.   On: 02/15/2018 17:35   Ct Abdomen Pelvis W Contrast  Result Date: 02/16/2018 CLINICAL DATA:  Abnormal liver function tests EXAM: CT ABDOMEN AND PELVIS WITH CONTRAST TECHNIQUE: Multidetector CT imaging of the abdomen and pelvis was performed using the standard protocol following bolus administration of intravenous contrast. CONTRAST:  75mL OMNIPAQUE IOHEXOL 300 MG/ML  SOLN COMPARISON:  04/30/2006 FINDINGS: Lower chest: Moderate bilateral pleural effusions with extensive clustered and consolidative airspace opacity. Hepatobiliary: No focal liver abnormality is seen. Status post cholecystectomy. No biliary  dilatation. Pancreas: Unremarkable. No pancreatic ductal dilatation or surrounding inflammatory changes. Spleen: Normal in size without focal abnormality. Adrenals/Urinary Tract: Adrenal glands are unremarkable. Kidneys are normal, without renal calculi, focal lesion, or hydronephrosis. Thickening of the urinary bladder, which is decompressed by Foley catheter. Stomach/Bowel: Stomach is within normal limits. Appendix appears normal. No evidence of bowel wall thickening, distention, or inflammatory changes. Vascular/Lymphatic: Mixed calcific atherosclerosis. No enlarged abdominal or pelvic lymph nodes. Reproductive: No mass or other abnormality. Other: No abdominal wall hernia or abnormality. Trace ascites in the low pelvis. Musculoskeletal: No acute or significant osseous findings. IMPRESSION: 1. No abnormality of the liver or biliary tract to explain abnormal LFTs. No biliary duct dilatation. Status post cholecystectomy. 2. Moderate bilateral pleural effusions with extensive clustered and consolidative airspace opacity. Findings are concerning for infection. 3. Nonspecific thickening of the urinary bladder, which is decompressed by Foley catheter. Correlate with urinalysis for evidence of infection. 4.  Trace nonspecific ascites in the low pelvis. Electronically Signed   By: Lauralyn Primes M.D.   On: 02/16/2018 14:42   Dg Chest Port 1 View  Result Date: 02/16/2018 CLINICAL DATA:  Dyspnea and sinus tachycardia EXAM: PORTABLE CHEST 1 VIEW COMPARISON:  Yesterday FINDINGS: Low volume chest with Kerley lines and cardiomegaly. Prior CABG. No effusion or pneumothorax. IMPRESSION: Mild pulmonary edema. Electronically Signed   By: Marnee Spring M.D.   On: 02/16/2018 07:50   Dg Chest Port 1 View  Result Date: 02/15/2018 CLINICAL DATA:  Leukocytosis; pt reports fever, nausea, and SOB x 2 days; also c/o diarrhea x 2 days. N/V x 24 hours; hx of DM EXAM: PORTABLE CHEST 1 VIEW COMPARISON:  10/18/2016 FINDINGS: Median  sternotomy. Heart size appears enlarged but is also accentuated by portable technique. Mildly prominent interstitial markings primarily at the bases favor pulmonary edema. No frank consolidations. IMPRESSION: Cardiomegaly and mild interstitial edema. Electronically Signed   By: Norva Pavlov M.D.   On: 02/15/2018 08:39    Wt Readings from Last 3 Encounters:  02/16/18 78.9 kg  10/19/16 70.3 kg    EKG: Sinus rhythm, anteroseptal Q waves, no acute EKG  Physical Exam: Blood pressure 105/67, pulse 93, temperature 98.5 F (36.9 C), temperature source Oral, resp. rate 18, weight 78.9 kg, SpO2 95 %.  Body mass index is 27.24 kg/m. General: Well developed, well nourished, in no acute distress. Head: Normocephalic, atraumatic, sclera non-icteric, no xanthomas, nares are without discharge.  Neck: Negative for carotid bruits. JVD not elevated. Lungs: Clear bilaterally to auscultation without wheezes, rales, or rhonchi. Breathing is unlabored. Heart: RRR with S1 S2. No murmurs, rubs, or gallops appreciated. Abdomen: Soft, non-tender, non-distended with normoactive bowel sounds. No hepatomegaly. No rebound/guarding. No obvious abdominal masses. Msk:  Strength and tone appear normal for age. Extremities: No clubbing or cyanosis. No edema.  Distal pedal pulses are 2+ and equal bilaterally. Neuro: Alert and oriented X 3. No facial asymmetry. No focal deficit. Moves all extremities spontaneously. Psych:  Responds to questions appropriately with a normal affect.     Assessment and Plan  68 year old man with known coronary disease who presents with sepsis of unclear source, and happens to have elevated troponins in this context.  Overall his EF is preserved on echo today with regional wall motion abnormalities, presumably related to prior infarcts.  He has not had any complaints of chest pain.  The clinical scenario likely represents represents demand ischemia in the setting of severe sepsis.  Could  consider an ischemic evaluation as an outpatient although this is certainly not urgent.  It is not unreasonable to do a short course of IV heparin and the absence of any contraindications, although given the incredibly low likelihood of plaque rupture physiology this is not strictly necessary.  Continue home aspirin and Plavix.  Signed, Esmond PlantsJaidip Shyam Dawson, MD 02/16/2018, 11:27 PM

## 2018-02-16 NOTE — Progress Notes (Signed)
ANTICOAGULATION CONSULT NOTE - Initial Consult  Pharmacy Consult for heparin Indication: chest pain/ACS  Allergies  Allergen Reactions  . Codeine     GI upset    Patient Measurements: Weight: 173 lb 15.1 oz (78.9 kg) Heparin Dosing Weight: 79 kg  Vital Signs: Temp: 97.8 F (36.6 C) (02/10 1137) Temp Source: Oral (02/10 1137) BP: 104/66 (02/10 1445) Pulse Rate: 95 (02/10 1445)  Labs: Recent Labs    02/15/18 0230 02/15/18 0850 02/15/18 1727 02/16/18 0340 02/16/18 0943 02/16/18 1529  HGB 13.9  --   --  10.9*  --   --   HCT 45.0  --   --  33.6*  --   --   PLT 191  --   --  155  --   --   CREATININE 1.70*  --  1.70* 1.83*  --   --   TROPONINI  --  <0.03  --   --  0.98* 2.38*    CrCl cannot be calculated (Unknown ideal weight.).   Medical History: Past Medical History:  Diagnosis Date  . Diabetes mellitus (HCC)    Assessment: 39 YOM admitted on 2/9 with possible sepsis, now with troponin elevation, pharmacy is consulted to start IV heparin. Noted pt received lovenox 40 mg this afternoon at 1450. hgb 10.9. pltc 155K  Goal of Therapy:  Heparin level 0.3-0.7 units/ml Monitor platelets by anticoagulation protocol: Yes   Plan:  -Heparin bolus 4000 units -Heparin infusion 950 units/hr -f/u 6 hr heparin level at midnight  -Daily heparin level and CBC  Bayard Hugger, PharmD, BCPS, BCPPS Clinical Pharmacist  Pager: [phone]   02/16/2018,5:19 PM

## 2018-02-16 NOTE — Progress Notes (Signed)
Inpatient Diabetes Program Recommendations  AACE/ADA: New Consensus Statement on Inpatient Glycemic Control (2015)  Target Ranges:  Prepandial:   less than 140 mg/dL      Peak postprandial:   less than 180 mg/dL (1-2 hours)      Critically ill patients:  140 - 180 mg/dL   Lab Results  Component Value Date   GLUCAP 280 (H) 02/16/2018   HGBA1C 6.7 (H) 10/19/2016    Review of Glycemic Control Results for [name], Philip OctaveHELMUT R JR. (MRN [id number]) as of 02/16/2018 13:40  Ref. Range 02/15/2018 17:27 02/15/2018 22:14 02/16/2018 05:31 02/16/2018 07:58 02/16/2018 11:34  Glucose-Capillary Latest Ref Range: 70 - 99 mg/dL 409138 (H) 811163 (H) 914212 (H) 251 (H) 280 (H)   Diabetes history: DM 2 Outpatient Diabetes medications:  NPH 20 units bid, Metformin 1000 mg bid Current orders for Inpatient glycemic control:  Novolog sensitive tid with meals and HS Inpatient Diabetes Program Recommendations:   May consider adding Lantus 12 units daily while patient is in the hospital.   Thanks,  Philip MeagerJenny Mirl Hillery, RN, BC-ADM Inpatient Diabetes Coordinator Pager [phone] (8a-5p)

## 2018-02-16 NOTE — Progress Notes (Signed)
CRITICAL VALUE ALERT  Critical Value:  Lactic Acid 3.3  Date & Time Notied: 02/16/18 @ 1038  Provider Notified: MD paged  Orders Received/Actions taken: Will await any new orders

## 2018-02-16 NOTE — Progress Notes (Signed)
Paged regarding patient being anxious with HR in the 130s and SpO2 in the 60's during episode. He was previously on 2L [place] with oxygen saturation in the high 90s which was transitioned to non-rebreather. Once patient calmed down, HR was <110 and SpO2 in the high 90s. He was hemodynamically stable.   On evaluation of patient, he denied SOB, chest pain. He was alert and oriented though nursing Andy reports that he was altered prior to my arrival and obtained ABG which is fine. Lungs were clear to auscultation and heart sounds were unremarkable.   Plan: -I ordered an EKG to evaluate tachycardia.  -Moving forward I believe, he will require some form of anxiolytic prn.

## 2018-02-17 ENCOUNTER — Inpatient Hospital Stay (HOSPITAL_COMMUNITY): Payer: Medicare Other

## 2018-02-17 ENCOUNTER — Other Ambulatory Visit: Payer: Self-pay

## 2018-02-17 DIAGNOSIS — I472 Ventricular tachycardia: Secondary | ICD-10-CM

## 2018-02-17 DIAGNOSIS — R0902 Hypoxemia: Secondary | ICD-10-CM | POA: Diagnosis present

## 2018-02-17 DIAGNOSIS — I959 Hypotension, unspecified: Secondary | ICD-10-CM

## 2018-02-17 DIAGNOSIS — R7989 Other specified abnormal findings of blood chemistry: Secondary | ICD-10-CM

## 2018-02-17 DIAGNOSIS — A419 Sepsis, unspecified organism: Secondary | ICD-10-CM | POA: Diagnosis present

## 2018-02-17 DIAGNOSIS — R000 Tachycardia, unspecified: Secondary | ICD-10-CM

## 2018-02-17 LAB — HEPARIN LEVEL (UNFRACTIONATED)
HEPARIN UNFRACTIONATED: 0.24 [IU]/mL — AB (ref 0.30–0.70)
Heparin Unfractionated: 0.26 IU/mL — ABNORMAL LOW (ref 0.30–0.70)
Heparin Unfractionated: 0.58 IU/mL (ref 0.30–0.70)

## 2018-02-17 LAB — BASIC METABOLIC PANEL
Anion gap: 13 (ref 5–15)
BUN: 23 mg/dL (ref 8–23)
CO2: 24 mmol/L (ref 22–32)
Calcium: 8 mg/dL — ABNORMAL LOW (ref 8.9–10.3)
Chloride: 99 mmol/L (ref 98–111)
Creatinine, Ser: 1.94 mg/dL — ABNORMAL HIGH (ref 0.61–1.24)
GFR calc Af Amer: 40 mL/min — ABNORMAL LOW (ref >60)
GFR calc non Af Amer: 34 mL/min — ABNORMAL LOW (ref >60)
Glucose, Bld: 238 mg/dL — ABNORMAL HIGH (ref 70–99)
Potassium: 3.9 mmol/L (ref 3.5–5.1)
Sodium: 136 mmol/L (ref 135–145)

## 2018-02-17 LAB — CBC
HCT: 35.5 % — ABNORMAL LOW (ref 39.0–52.0)
Hemoglobin: 11.9 g/dL — ABNORMAL LOW (ref 13.0–17.0)
MCH: 30.5 pg (ref 26.0–34.0)
MCHC: 33.5 g/dL (ref 30.0–36.0)
MCV: 91 fL (ref 80.0–100.0)
NRBC: 0 % (ref 0.0–0.2)
Platelets: 187 10*3/uL (ref 150–400)
RBC: 3.9 MIL/uL — ABNORMAL LOW (ref 4.22–5.81)
RDW: 14.1 % (ref 11.5–15.5)
WBC: 14.2 10*3/uL — ABNORMAL HIGH (ref 4.0–10.5)

## 2018-02-17 LAB — GLUCOSE, CAPILLARY
GLUCOSE-CAPILLARY: 254 mg/dL — AB (ref 70–99)
Glucose-Capillary: 274 mg/dL — ABNORMAL HIGH (ref 70–99)
Glucose-Capillary: 280 mg/dL — ABNORMAL HIGH (ref 70–99)
Glucose-Capillary: 247 mg/dL — ABNORMAL HIGH (ref 70–99)
Glucose-Capillary: 193 mg/dL — ABNORMAL HIGH (ref 70–99)

## 2018-02-17 LAB — MAGNESIUM: Magnesium: 1.4 mg/dL — ABNORMAL LOW (ref 1.7–2.4)

## 2018-02-17 LAB — TROPONIN I: Troponin I: 6.95 ng/mL (ref <0.03)

## 2018-02-17 LAB — PHOSPHORUS: Phosphorus: 2.8 mg/dL (ref 2.5–4.6)

## 2018-02-17 LAB — PROCALCITONIN: Procalcitonin: 2.1 ng/mL

## 2018-02-17 MED ORDER — LORAZEPAM 2 MG/ML IJ SOLN: 1.0000 mg | Freq: Four times a day (QID) | INTRAMUSCULAR | Status: DC | PRN

## 2018-02-17 MED ORDER — HEPARIN (PORCINE) 25000 UT/250ML-% IV SOLN
1600.0000 [IU]/h | INTRAVENOUS | Status: DC
  Administered 2018-02-18 – 2018-02-21 (×5): 1500 [IU]/h via INTRAVENOUS
  Administered 2018-02-22: 1600 [IU]/h via INTRAVENOUS
  Filled 2018-02-17 (×8): qty 250

## 2018-02-17 MED ORDER — VANCOMYCIN HCL 10 G IV SOLR: 1750.0000 mg | INTRAVENOUS | Status: DC

## 2018-02-17 MED ORDER — MAGNESIUM SULFATE 2 GM/50ML IV SOLN
2.0000 g | Freq: Once | INTRAVENOUS | Status: AC
  Administered 2018-02-17: 2 g via INTRAVENOUS
  Filled 2018-02-17: qty 50

## 2018-02-17 MED ORDER — INSULIN GLARGINE 100 UNIT/ML ~~LOC~~ SOLN
12.0000 [IU] | Freq: Every day | SUBCUTANEOUS | Status: DC
  Filled 2018-02-17: qty 0.12

## 2018-02-17 MED ORDER — INSULIN ASPART 100 UNIT/ML ~~LOC~~ SOLN: 0.0000 [IU] | Freq: Every day | SUBCUTANEOUS | Status: DC

## 2018-02-17 MED ORDER — INSULIN GLARGINE 100 UNIT/ML ~~LOC~~ SOLN
10.0000 [IU] | Freq: Every day | SUBCUTANEOUS | Status: DC
  Administered 2018-02-17 – 2018-02-18 (×2): 10 [IU] via SUBCUTANEOUS
  Filled 2018-02-17 (×3): qty 0.1

## 2018-02-17 MED ORDER — HEPARIN BOLUS VIA INFUSION
1000.0000 [IU] | Freq: Once | INTRAVENOUS | Status: AC
  Administered 2018-02-17: 1000 [IU] via INTRAVENOUS
  Filled 2018-02-17: qty 1000

## 2018-02-17 MED ORDER — SODIUM CHLORIDE 0.9 % IV SOLN
2.0000 g | Freq: Once | INTRAVENOUS | Status: AC
  Administered 2018-02-17: 2 g via INTRAVENOUS
  Filled 2018-02-17: qty 2

## 2018-02-17 MED ORDER — INSULIN ASPART 100 UNIT/ML ~~LOC~~ SOLN
3.0000 [IU] | Freq: Three times a day (TID) | SUBCUTANEOUS | Status: DC
  Administered 2018-02-18 – 2018-02-19 (×5): 3 [IU] via SUBCUTANEOUS

## 2018-02-17 MED ORDER — SODIUM CHLORIDE 0.9 % IV SOLN
2.0000 g | Freq: Two times a day (BID) | INTRAVENOUS | Status: DC
  Administered 2018-02-18 – 2018-02-20 (×5): 2 g via INTRAVENOUS
  Filled 2018-02-17 (×6): qty 2

## 2018-02-17 MED ORDER — INSULIN ASPART 100 UNIT/ML ~~LOC~~ SOLN
0.0000 [IU] | Freq: Three times a day (TID) | SUBCUTANEOUS | Status: DC
  Administered 2018-02-18: 5 [IU] via SUBCUTANEOUS
  Administered 2018-02-18: 3 [IU] via SUBCUTANEOUS
  Administered 2018-02-18 – 2018-02-19 (×4): 5 [IU] via SUBCUTANEOUS
  Administered 2018-02-20 (×3): 3 [IU] via SUBCUTANEOUS
  Administered 2018-02-21: 2 [IU] via SUBCUTANEOUS
  Administered 2018-02-21: 3 [IU] via SUBCUTANEOUS
  Administered 2018-02-21: 5 [IU] via SUBCUTANEOUS
  Administered 2018-02-22 (×2): 3 [IU] via SUBCUTANEOUS
  Administered 2018-02-23: 5 [IU] via SUBCUTANEOUS
  Administered 2018-02-23: 3 [IU] via SUBCUTANEOUS
  Administered 2018-02-23: 8 [IU] via SUBCUTANEOUS
  Administered 2018-02-24 (×2): 2 [IU] via SUBCUTANEOUS
  Administered 2018-02-24: 3 [IU] via SUBCUTANEOUS
  Administered 2018-02-25 (×2): 5 [IU] via SUBCUTANEOUS

## 2018-02-17 MED ORDER — VANCOMYCIN HCL 10 G IV SOLR
1500.0000 mg | Freq: Once | INTRAVENOUS | Status: AC
  Administered 2018-02-17: 1500 mg via INTRAVENOUS
  Filled 2018-02-17: qty 1500

## 2018-02-17 MED ORDER — FUROSEMIDE 10 MG/ML IJ SOLN
20.0000 mg | Freq: Once | INTRAMUSCULAR | Status: AC
  Administered 2018-02-17: 20 mg via INTRAVENOUS
  Filled 2018-02-17: qty 2

## 2018-02-17 NOTE — Progress Notes (Addendum)
Subjective: [name] is resting comfortably in bed today, he denies any new complaints.  He states that he is feeling better today compared to yesterday however still still have some confusion.  He denies any chest pain but is still having some shortness of breath.  He does know the president is but is unable to tell [place] what year it is or where he is.  He did report that the urinary catheter was bothering him and was asking if we can take it out.  We discussed that we would need to keep it in for day or 2 until we can know what is going on.  Overnight patient had an episode of diaphoresis and having shortness of breath and possible chest pain.  He was given relaxation techniques and breathing and appeared very anxious. CXR was ordered that showed worsening pulmonary edema.   Objective:  Vital signs in last 24 hours: Vitals:   02/17/18 0430 02/17/18 0445 02/17/18 0500 02/17/18 0600  BP:      Pulse: 96 95 95 93  Resp: (!) 24 (!) 23 (!) 23 19  Temp:      TempSrc:      SpO2: 94% 94% 95% 93%  Weight:      Height:        General: Frail appearing, NAD, on 5L HFNC Cardiac: RRR, no m/r/g Pulmonary: Bilateral basilar crackles, normal work of breathing, on 5L [place] Abdomen: Soft, non-tender   Assessment/Plan:  Active Problems:   Transaminitis   Shock (HCC)   Severe sepsis (HCC)   Acute respiratory failure with hypoxia (HCC) This is a 68 year old male with a history of CAD s/p CABG, MCA CVA, DMII, HTN, and CKD who presented with a 1 day history of n/v, diarrhea, confusion and lethargy. Found to be hypotensive with a SBP of 70 and hypoglycemic with a CBG of 69. He was found to be febrile to 101, and hypotensive. He was fluid resuscitated however SBPs still remained low. Started on zosyn and azithromycin.  Severe hypotension 2/2emesis: Shock, likely septic 2/2 unknown source: -Blood pressure has improved today, systolic around 120s now.  He is continued on Zosyn and azithromycin. He has  remained afebrile since admission however his leukocytosisis still been going up.  Lactic acid had initially resolved then stopped and now has come back down today.  Blood cultures have been negative today.  No other source of infection identified at this time. He has some improvement in his mental status today however still has some confusion, unclear how close it is to his baseline.  CT abdomen was unremarkable, no clear source of infection was identified.  Chest x-ray today showed worsening lateral pulmonary edema, no areas of consolidation noted.  Blood cultures have been negative.  Full skin check was completed yesterday and no wounds or lesions are noted.  He does have a callus on his right foot near the first metatarsal, this does not look infection, no erythema, edema, or tenderness. CXR today showed worsening pulmonary edema and he still has bibasilar crackles on exam, he will need diuresis today. -Continue zosyn and azithromycin -F/u blood cultures, they have had NGTD -Continue [place], wean as tolerated -Continue to trend lactic acid -Tylenol PRN for fevers -Imodium for diarrhea -Lasix today, monitor I+Os -Will contact wife to see where his baseline is  SVT: -Patient has missed his beta-blocker medications and he has been fluid resuscitated. This may be multifactorial with him missing his home medications and him having some  fluid overload.  He did have a short run overnight where he was asymptomatic. -Continue telemetry -Blood pressures have improved today, we will restart his home metoprolol at a lower dose and continue to monitor  ?Anxiety: Patient has been having these episodes of diaphoresis, hypoxia, and tachycardia.  He will have complaints of shortness of breath and some anxiety and agitation.  This may be all related to his pulmonary edema and episodes of SVT however possible that a anxiety attack may be contributing.  We will do a trial of Ativan as needed to see if this may  help. -Ativan 1 mg as needed for anxiety  AKI: -Still present, patient has remained hypotensive while here with minimal response to fluid resuscitation. BP have improved slightly. Will continue to monitor Cr for now.  -Continue to follow CMP  H/o CAD s/p CABG: Troponinemia: -On plavix and ASA at home, patient reported that he had been taking this. He has been having elevated troponin, from 2.38 to 6.95 overnight. He was started on a heparin drip and cardiology was consulted. He has not been having chest pain but has been having episodes of shortness of breath that may just be related to his pulmonary edema seen on CXR.  -Continue Plavix and ASA -Cardiology following, appreciate recommendations  Diabetes: -On insulin and metformin at home. Has a history of hypoglycemia however CBG on admission was 69.CBGs  More elevated today.  -Continue SSI -Frequent CBGs  FEN: No fluids, replete lytes prn, NPO VTE ppx: Lovenox  Code Status: FULL    Dispo: Anticipated discharge is pending clinical improvement.  Claudean Severance, MD 02/17/2018, 6:29 AM Pager: [phone]

## 2018-02-17 NOTE — Plan of Care (Signed)
Discussed with patient plan of care for the evening, signs and symptoms of chest pain and measures/interventions performed throughout the shift with some teach back displayed.  IMTS on call MD called about critical lab values and Cardiology consult MD saw the patient tonight.

## 2018-02-17 NOTE — Progress Notes (Signed)
  Date: 02/17/2018  Patient name: Philip [name].  Medical record number: [id number]  Date of birth: 25-Apr-1950   I have seen and evaluated this patient and I have discussed the plan of care with the house [facility]. Please see Dr. Sylvester HarderKrienke's note for complete details. I concur with her findings and plan with the following additions/corrections:   Reviewed Dr. Percival SpanishYacoub's note and discussed with team, he will be changed to Vancomycin and Cefepime for unknown source of infection at this time.   Philip [name], Philip Skillin B, MD 02/17/2018, 9:33 PM

## 2018-02-17 NOTE — Progress Notes (Signed)
ANTICOAGULATION CONSULT NOTE - Follow-Up Consult  Pharmacy Consult for heparin Indication: chest pain/ACS  Allergies  Allergen Reactions  . Codeine     GI upset    Patient Measurements: Height: 5\' 8"  (172.7 cm)(will verify) Weight: 165 lb 12.6 oz (75.2 kg) IBW/kg (Calculated) : 68.4 Heparin Dosing Weight: 75.2kg  Vital Signs: Temp: 98.4 F (36.9 C) (02/11 0812) Temp Source: Oral (02/11 0812) BP: 101/68 (02/11 1147) Pulse Rate: 98 (02/11 1232)  Labs: Recent Labs    02/15/18 0230  02/15/18 1727 02/16/18 0340  02/16/18 1529 02/16/18 1915 02/17/18 0013 02/17/18 0741 02/17/18 1528  HGB 13.9  --   --  10.9*  --   --   --  11.9*  --   --   HCT 45.0  --   --  33.6*  --   --   --  35.5*  --   --   PLT 191  --   --  155  --   --   --  187  --   --   HEPARINUNFRC  --   --   --   --   --   --   --  0.58 0.26* 0.24*  CREATININE 1.70*  --  1.70* 1.83*  --   --   --  1.94*  --   --   TROPONINI  --    < >  --   --    < > 2.38* 5.24* 6.95*  --   --    < > = values in this interval not displayed.    Estimated Creatinine Clearance: 34.8 mL/min (A) (by C-G formula based on SCr of 1.94 mg/dL (H)).   Medical History: Past Medical History:  Diagnosis Date  . Diabetes mellitus (HCC)    Assessment: 61 YOM admitted on 2/9 with possible sepsis, now with troponin elevation, pharmacy is consulted to start IV heparin.   Heparin level this afternoon remains SUBtherapeutic despite a rate increase this morning (HL 0.24 << 0.26, goal of 0.3-0.7). RN noted drip beeping and/or paused ~30 minutes prior to level drawn this afternoon - so could be falsely low due to this. CBC stable - no bleeding noted. Will increase slightly and recheck a level in 6 hours.   Goal of Therapy:  Heparin level 0.3-0.7 units/ml Monitor platelets by anticoagulation protocol: Yes   Plan:  - Increase Heparin to 1200 units/hr (12 ml/hr) - Will continue to monitor for any signs/symptoms of bleeding and will follow up  with heparin level in 6 hours   Thank you for allowing pharmacy to be a part of this patient's care.  Georgina Pillion, PharmD, BCPS Clinical Pharmacist Clinical phone for 02/17/2018: [phone] 02/17/2018 4:47 PM   **Pharmacist phone directory can now be found on [email or web address] ([email or web address]).  Listed under [facility].

## 2018-02-17 NOTE — Progress Notes (Signed)
ANTICOAGULATION CONSULT NOTE - Follow Up Consult  Pharmacy Consult for heparin Indication: r/o ACS  Labs: Recent Labs    02/15/18 0230  02/15/18 1727 02/16/18 0340 02/16/18 0943 02/16/18 1529 02/16/18 1915 02/17/18 0013  HGB 13.9  --   --  10.9*  --   --   --  11.9*  HCT 45.0  --   --  33.6*  --   --   --  35.5*  PLT 191  --   --  155  --   --   --  187  HEPARINUNFRC  --   --   --   --   --   --   --  0.58  CREATININE 1.70*  --  1.70* 1.83*  --   --   --  1.94*  TROPONINI  --    < >  --   --  0.98* 2.38* 5.24*  --    < > = values in this interval not displayed.    Assessment/Plan:  68yo male therapeutic on heparin with initial dosing for elevated troponin. Will continue gtt at current rate and confirm stable with additional level.   Vernard Gambles, PharmD, BCPS  02/17/2018,1:06 AM

## 2018-02-17 NOTE — Progress Notes (Signed)
Inpatient Diabetes Program Recommendations  AACE/ADA: New Consensus Statement on Inpatient Glycemic Control (2015)  Target Ranges:  Prepandial:   less than 140 mg/dL      Peak postprandial:   less than 180 mg/dL (1-2 hours)      Critically ill patients:  140 - 180 mg/dL   Lab Results  Component Value Date   GLUCAP 274 (H) 02/17/2018   HGBA1C 6.7 (H) 10/19/2016    Results for [name], Philip CAVELL (MRN [id number]) as of 02/17/2018 11:42  Ref. Range 02/16/2018 07:58 02/16/2018 11:34 02/16/2018 16:51 02/16/2018 22:20 02/17/2018 03:50 02/17/2018 08:19  Glucose-Capillary Latest Ref Range: 70 - 99 mg/dL 272 (H) 536 (H) 644 (H) 217 (H) 254 (H) 274 (H)      Diabetes history: DM 2  Outpatient Diabetes medications: NPH 20 units bid, Metformin 1000 mg bid  Current orders for Inpatient glycemic control: Novolog sensitive (0-9 units) tid with meals and (0-5 units) HS                                                                           Lantus 6 units q hs (started last pm)                                                                              As CBG still elevated this am at 274 mg/dl suggest increasing basal insulin dose.      Inpatient Diabetes Program Recommendations:    MD please consider:   1. Increase Lantus to 12 units q hs (based on 0.15units/kg)                                         2. Ordering Hgb A1c (last one 11/06/16)    -- Will follow during hospitalization.--   Jamelle Rushing RN, MSN Diabetes Coordinator Inpatient Glycemic Control Team Team Pager: [phone] (8am-5pm)

## 2018-02-17 NOTE — Progress Notes (Signed)
Patient woke up around 350 this morning diaphoretic, pale, complaining of breathing through a straw and upper chest pressure.  Blood sugar, vital signs and 12 lead EKG were obtained (see Flow sheet).  Patient given a cooled down with his forehead, back of neck and back.  Performed relaxation techniques and deep breathing with patient presenting with high volume of anxiety.  On call MD contacted, RT, RRT and E-link updated.  Patient resting without any respiratory distress with eyes closed at this time.

## 2018-02-17 NOTE — Progress Notes (Signed)
ANTICOAGULATION CONSULT NOTE - Initial Consult  Pharmacy Consult for heparin Indication: chest pain/ACS  Allergies  Allergen Reactions  . Codeine     GI upset    Patient Measurements: Height: 5\' 8"  (172.7 cm)(will verify) Weight: 165 lb 12.6 oz (75.2 kg) IBW/kg (Calculated) : 68.4 Heparin Dosing Weight: 75.2kg  Vital Signs: Temp: 98.4 F (36.9 C) (02/11 0812) Temp Source: Oral (02/11 0812) BP: 127/66 (02/11 0812) Pulse Rate: 104 (02/11 0812)  Labs: Recent Labs    02/15/18 0230  02/15/18 1727 02/16/18 0340  02/16/18 1529 02/16/18 1915 02/17/18 0013 02/17/18 0741  HGB 13.9  --   --  10.9*  --   --   --  11.9*  --   HCT 45.0  --   --  33.6*  --   --   --  35.5*  --   PLT 191  --   --  155  --   --   --  187  --   HEPARINUNFRC  --   --   --   --   --   --   --  0.58 0.26*  CREATININE 1.70*  --  1.70* 1.83*  --   --   --  1.94*  --   TROPONINI  --    < >  --   --    < > 2.38* 5.24* 6.95*  --    < > = values in this interval not displayed.    Estimated Creatinine Clearance: 34.8 mL/min (A) (by C-G formula based on SCr of 1.94 mg/dL (H)).   Medical History: Past Medical History:  Diagnosis Date  . Diabetes mellitus (HCC)    Assessment: 89 YOM admitted on 2/9 with possible sepsis, now with troponin elevation, pharmacy is consulted to start IV heparin. Noted pt received lovenox 40 mg this afternoon at 1450. hgb 10.9. pltc 155K  Goal of Therapy:  Heparin level 0.3-0.7 units/ml Monitor platelets by anticoagulation protocol: Yes   Plan:  -Heparin bolus 1000 units -Increase Heparin infusion 1100 units/hr -f/u 6 hr heparin level -Daily heparin level and CBC   Noah Delaine, RPh Clinical Pharmacist [phone] Please check AMION for all [facility] phone numbers After 10:00 PM, call Main Pharmacy [phone] 02/17/2018,8:49 AM

## 2018-02-17 NOTE — Progress Notes (Signed)
Pharmacy Antibiotic Note  Philip Ridge Manfredonia [name]. is a 68 y.o. male admitted on 02/15/2018 with acute encephalopathy and concern for sepsis of possible PNA source. The patient has not improved on the current antibiotic regimen of Zosyn & Azithromycin. Pharmacy has been consulted to change Zosyn to Cefepime and add Vancomycin, continuing Azithro per MD.   Vancomycin 1750 mg IV Q 48 hrs. Goal AUC 400-550. Expected AUC: 485.9 SCr used: 1.94  Plan: - Vanc 1500 mg IV x 1 followed by 1750 mg IV every 48 hours - Cefepime 2g IV every 12 hours - Will continue to follow renal function, culture results, LOT, and antibiotic de-escalation plans   Height: 5\' 8"  (172.7 cm)(will verify) Weight: 165 lb 12.6 oz (75.2 kg) IBW/kg (Calculated) : 68.4  Temp (24hrs), Avg:98.3 F (36.8 C), Min:97.9 F (36.6 C), Max:98.5 F (36.9 C)  Recent Labs  Lab 02/15/18 0230 02/15/18 0850 02/15/18 1202 02/15/18 1727 02/16/18 0340 02/16/18 0947 02/16/18 1529 02/16/18 1915 02/17/18 0013  WBC 10.9*  --   --   --  11.4*  --   --   --  14.2*  CREATININE 1.70*  --   --  1.70* 1.83*  --   --   --  1.94*  LATICACIDVEN  --  3.2* 1.6  --   --  3.3* 2.1* 1.6  --     Estimated Creatinine Clearance: 34.8 mL/min (A) (by C-G formula based on SCr of 1.94 mg/dL (H)).    Allergies  Allergen Reactions  . Codeine     GI upset    Antimicrobials this admission: CTX 2/9 x 1 Zosyn 2/9 >> 2/11 Azithro 2/9 >> Vanc 2/11 >> Cefepime 2/11 >>  Dose adjustments this admission: n/a  Microbiology results: 2/9 Resp panel >> neg 2/9 BCx >> ngtd 2/10 MRSA PCR >> positive  Thank you for allowing pharmacy to be a part of this patient's care.  Georgina Pillion, PharmD, BCPS Clinical Pharmacist Clinical phone for 02/17/2018: [phone] 02/17/2018 5:49 PM   **Pharmacist phone directory can now be found on [email or web address] ([email or web address]).  Listed under [facility].

## 2018-02-17 NOTE — Progress Notes (Addendum)
Progress Note  Patient Name: Philip RidgeHelmut R [name] Philip HagemanJr. Date of Encounter: 02/17/2018  Primary Cardiologist: [facility] Newt [facility]Gandhi, [facility]  Subjective   Resting comfortably in bed w/o complaints, but still appears confused at times. Denies CP.   Inpatient Medications    Scheduled Meds: . aspirin EC  81 mg Oral Daily  . Chlorhexidine Gluconate Cloth  6 each Topical Q0600  . citalopram  20 mg Oral Daily  . clopidogrel  75 mg Oral Daily  . insulin aspart  0-5 Units Subcutaneous QHS  . insulin aspart  0-9 Units Subcutaneous TID WC  . insulin glargine  6 Units Subcutaneous QHS  . mouth rinse  15 mL Mouth Rinse BID  . mupirocin ointment  1 application Nasal BID   Continuous Infusions: . azithromycin (ZITHROMAX) 500 MG IVPB (Vial-Mate [employer]or) Stopped (02/16/18 1859)  . heparin 1,100 Units/hr (02/17/18 0859)  . piperacillin-tazobactam (ZOSYN)  IV 3.375 g (02/17/18 0502)   PRN Meds: acetaminophen **OR** acetaminophen   Vital Signs    Vitals:   02/17/18 0445 02/17/18 0500 02/17/18 0600 02/17/18 0812  BP:    127/66  Pulse: 95 95 93 (!) 104  Resp: (!) 23 (!) 23 19 20   Temp:    98.4 F (36.9 C)  TempSrc:    Oral  SpO2: 94% 95% 93% 90%  Weight:      Height:        Intake/Output Summary (Last 24 hours) at 02/17/2018 1014 Last data filed at 02/17/2018 0900 Gross per 24 hour  Intake 777.82 ml  Output 2650 ml  Net -1872.18 ml   Last 3 Weights 02/17/2018 02/16/2018 02/16/2018  Weight (lbs) 165 lb 12.6 oz 173 lb 15.1 oz 173 lb 14.4 oz  Weight (kg) 75.2 kg 78.9 kg 78.881 kg      Telemetry    NSVT 16 beats - Personally Reviewed  ECG    Sinus rhythm, anteroseptal Q waves, non acute EKG - Personally Reviewed  Physical Exam   GEN: WM in no acute distress.   Neck: No JVD Cardiac: RRR, no murmurs, rubs, or gallops.  Respiratory: decreased BS at the bases GI: Soft, nontender, non-distended  MS: No edema; No deformity. Neuro:  Nonfocal  Psych: Normal affect   Labs    Chemistry Recent  Labs  Lab 02/15/18 0230 02/15/18 1727 02/16/18 0340 02/17/18 0013  NA 142 140 138 136  K 4.6 4.8 4.2 3.9  CL 105 107 105 99  CO2 22 23 23 24   GLUCOSE 135* 146* 201* 238*  BUN 24* 25* 24* 23  CREATININE 1.70* 1.70* 1.83* 1.94*  CALCIUM 9.1 7.8* 7.7* 8.0*  PROT 6.7 5.7* 5.9*  --   ALBUMIN 3.5 2.8* 2.9*  --   AST 132* 52* 38  --   ALT 104* 76* 62*  --   ALKPHOS 85 58 59  --   BILITOT 1.2 0.9 1.3*  --   GFRNONAA 40* 40* 37* 34*  GFRAA 47* 47* 43* 40*  ANIONGAP 15 10 10 13      Hematology Recent Labs  Lab 02/15/18 0230 02/16/18 0340 02/17/18 0013  WBC 10.9* 11.4* 14.2*  RBC 4.75 3.61* 3.90*  HGB 13.9 10.9* 11.9*  HCT 45.0 33.6* 35.5*  MCV 94.7 93.1 91.0  MCH 29.3 30.2 30.5  MCHC 30.9 32.4 33.5  RDW 13.9 14.2 14.1  PLT 191 155 187    Cardiac Enzymes Recent Labs  Lab 02/16/18 0943 02/16/18 1529 02/16/18 1915 02/17/18 0013  TROPONINI 0.98* 2.38* 5.24* 6.95*  No results for input(s): TROPIPOC in the last 168 hours.   BNP Recent Labs  Lab 02/15/18 0230  BNP 124.5*     DDimer No results for input(s): DDIMER in the last 168 hours.   Radiology    US Abdomen Complete  Result Date: 02/15/2018 CLINICAL DATA:  Transaminitis, status post cholecystectomy EXAM: ABDOMEN ULTRASOUND COMPLETE COMPARISON:  None. FINDINGS: Gallbladder: Surgically absent. Common bile duct: Diameter: 5 mm Liver: Hyperechoic hepatic parenchyma. No focal hepatic lesion is seen. Mild intrahepatic ductal dilatation is possible. Portal vein is patent on color Doppler imaging with normal direction of blood flow towards the liver. IVC: No abnormality visualized. Pancreas: Poorly visualized/obscured by bowel gas. Spleen: Size and appearance within normal limits. Right Kidney: Length: 9.6 cm. 10 x 9 x 11 mm interpolar cyst. No hydronephrosis. Left Kidney: Length: 10.9 cm.  No mass or hydronephrosis. Abdominal aorta: No aneurysm visualized. Other findings: None. IMPRESSION: Hyperechoic hepatic parenchyma,  nonspecific but raising the possibility of hepatic steatosis. Possible mild intrahepatic ductal dilatation. However, the common duct is nondilated. No focal hepatic lesion is evident on ultrasound. Electronically Signed   By: Charline Bills M.D.   On: 02/15/2018 17:35   Ct Abdomen Pelvis W Contrast  Result Date: 02/16/2018 CLINICAL DATA:  Abnormal liver function tests EXAM: CT ABDOMEN AND PELVIS WITH CONTRAST TECHNIQUE: Multidetector CT imaging of the abdomen and pelvis was performed using the standard protocol following bolus administration of intravenous contrast. CONTRAST:  42mL OMNIPAQUE IOHEXOL 300 MG/ML  SOLN COMPARISON:  04/30/2006 FINDINGS: Lower chest: Moderate bilateral pleural effusions with extensive clustered and consolidative airspace opacity. Hepatobiliary: No focal liver abnormality is seen. Status post cholecystectomy. No biliary dilatation. Pancreas: Unremarkable. No pancreatic ductal dilatation or surrounding inflammatory changes. Spleen: Normal in size without focal abnormality. Adrenals/Urinary Tract: Adrenal glands are unremarkable. Kidneys are normal, without renal calculi, focal lesion, or hydronephrosis. Thickening of the urinary bladder, which is decompressed by Foley catheter. Stomach/Bowel: Stomach is within normal limits. Appendix appears normal. No evidence of bowel wall thickening, distention, or inflammatory changes. Vascular/Lymphatic: Mixed calcific atherosclerosis. No enlarged abdominal or pelvic lymph nodes. Reproductive: No mass or other abnormality. Other: No abdominal wall hernia or abnormality. Trace ascites in the low pelvis. Musculoskeletal: No acute or significant osseous findings. IMPRESSION: 1. No abnormality of the liver or biliary tract to explain abnormal LFTs. No biliary duct dilatation. Status post cholecystectomy. 2. Moderate bilateral pleural effusions with extensive clustered and consolidative airspace opacity. Findings are concerning for infection. 3.  Nonspecific thickening of the urinary bladder, which is decompressed by Foley catheter. Correlate with urinalysis for evidence of infection. 4.  Trace nonspecific ascites in the low pelvis. Electronically Signed   By: Lauralyn Primes M.D.   On: 02/16/2018 14:42   Dg Chest Port 1 View  Result Date: 02/17/2018 CLINICAL DATA:  68 year old male with history of hypotension. EXAM: PORTABLE CHEST 1 VIEW COMPARISON:  Chest x-ray 02/16/2018. FINDINGS: There is cephalization of the pulmonary vasculature, indistinctness of the interstitial markings, and patchy airspace disease throughout the lungs bilaterally suggestive of moderate pulmonary edema. Small bilateral pleural effusions. Heart size is mildly enlarged. Upper mediastinal contours are within normal limits. Aortic atherosclerosis. Status post median sternotomy for CABG. IMPRESSION: 1. The appearance of the chest suggests worsening congestive heart failure, as above. Electronically Signed   By: Trudie Reed M.D.   On: 02/17/2018 08:39   Dg Chest Port 1 View  Result Date: 02/16/2018 CLINICAL DATA:  Dyspnea and sinus tachycardia EXAM: PORTABLE CHEST 1  VIEW COMPARISON:  Yesterday FINDINGS: Low volume chest with Kerley lines and cardiomegaly. Prior CABG. No effusion or pneumothorax. IMPRESSION: Mild pulmonary edema. Electronically Signed   By: Marnee Spring M.D.   On: 02/16/2018 07:50    Cardiac Studies   2D Echo 2018 [facility] FINDINGS:  LEFT VENTRICLE The left ventricular size is normal. Basal left ventricular septal  hypertrophy. Left ventricular systolic function is normal. LV ejection  fraction = 55-60%. Left ventricular filling pattern is normal. -  RIGHT VENTRICLE The right ventricle is normal size. The right ventricular systolic function  is normal.  LEFT ATRIUM The left atrial size is normal.  RIGHT ATRIUM  Right atrial size is normal. Injection of agitated saline showed no  right-to-left shunt. - AORTIC VALVE There is aortic valve  sclerosis. Focal calcification of the aortic valve.  There is no aortic regurgitation. - MITRAL VALVE The mitral valve leaflets appear thickened, but open well. There is trace  mitral regurgitation. - TRICUSPID VALVE Structurally normal tricuspid valve. There is trace tricuspid regurgitation.  RVSP not able to be calculated. - PULMONIC VALVE The pulmonic valve is normal in structure and function. There is no pulmonic  valvular regurgitation. - ARTERIES The aortic root is normal size. - VENOUS Pulmonary venous flow pattern is normal. The inferior vena cava was not  visualized during the exam. - EFFUSION There is no pericardial effusion.   2D Echo 02/15/18 IMPRESSIONS    1. The left ventricle has low normal systolic function of 50-55%.There is akinesis of the apical inferolateral, inferior, lateral, anterior and septal left ventricular segments.  2. The right ventricle has normal systolic function. The cavity was normal. There is no increase in right ventricular wall thickness.  3. The mitral valve is normal in structure. There is mild mitral annular calcification present.  4. The tricuspid valve is normal in structure.  5. The aortic valve has an indeterminant number of cusps There is moderate thickening and mild calcification of the aortic valve, with moderately decreased cusp excursion.  6. The pulmonic valve was normal in structure.  Patient Profile     68 y.o. male w/ h/o CAD, followed at [facility], s/p CABG in 2015, post operative atrial fibrillation, HTN, HLD, Diabetes, CKD and prior CVA, admitted for septic shock of unknown source. Cardiology was consulted for elevated troponin. Also noted to have NSVT on tele.   Assessment & Plan    1. Septic Shock: presented w/ n/v/d and AMS. Hypotensive in the ED w/ SBPs in the 70s and febrile w/ temp of 101. Lactic acid up to 3.3.  Started on broad spectrum antibiotics and IVF resuscitation. He has not required pressors. Blood cultures  pending. Abdominal /Pelvic CT and UA negative.  CXR showed mild pulmonary edema but no infiltrates. Respiratory panel negative. Flu negative. Strep and legionella negative. Further w/u per primary team. Per notes in care everywhere, he does have a h/o diabetic foot ulcers in the past. Needs full body check. Blood cultures pending to assess for bacteremia. If positive cultures, he will need TEE.   2. Elevated Troponin: significant elevation with upward trend, 2.38>>5.24>>6.95. EKg shows sinus rhythm, anteroseptal Q waves, non acute EKG. Pt with no recent h/o CP, however he is diabetic, thus ? Silent ischemia. This is also in the setting of septic shock of unknown source.  Echo done on 2/9 showed preserved LVEF but akinesis of the apical inferolateral, inferior, lateral, anterior and septal left ventricular segments. Unsure if WMAs are new or old. [facility]  [facility] with normal EF but no mention of wall motion in report. For now, recommend continuation of IV heparin and medical management of known CAD. Further inpatient work-up, including possible invasive cath to be determined based upon clinical course of sepsis and results of blood cultures.    3. CAD: underwent coronary angiography in 01/2012, which revealed severe 3-vessel coronary disease. Preserved left ventricular function at that time. He subsequently did undergo coronary artery bypass graft surgery in August 2015 with placement of a left internal mammary graft to the LAD, saphenous vein graft to the obtuse marginal, saphenous vein graft to the posterior descending artery. [facility] since [facility]. Denies any recent CP but trops elevated as outlined above concerning for ACS. ? Silent ischemia given DM. May need cath at some point once more medically stable. Continue IV heparin for now + ASA and Plavix. No statin ordered. LFTs were initially elevated but trending downward. Will plan to resume at some point. Holding PO   blockers due to soft BP. Continue to trend troponins to see if further rise in level.   4. NSVT: 16 beat run noted on tele but asymptomatic. Echo shows normal LVEF. K 3.9 this morning. Mg 1.4. He needs supplementation. Keep K >4.0 and Mg >2.0. He did received magnesium sulfate this morning, 2g IV. He has significant troponin elevation, thus ? Coronary ischemia. See above.  Underlying infectious process may also be contributing. Continue to monitor on tele. Now that BP has improved, we may try to add a low dose  blocker.  5. CKD: baseline SCr ~1.2. SCr was 1.7 on admit and has been trending upward, now at 1.94. Suspect likely prerenal from hypotension from sepsis resulting in renal hypoperfusion. IVFs off currently. Needs lasix, see below. Avoid other nephrotoxic agents. Monitor creatine trend.   6. DM: management per IM.   7. HFpEF: admit BNP 124.5. Initial CXR showed mild pulmonary edema. Repeat CXR this morning shows worsening CHF. Echo with normal EF at 50-55%. He is currently on high flow supplemental O2 via [place] at 6L/min and O2 sats are only at 90%. Decreased BS bilaterally. Suspect some of volume overload is likely secondary to aggressive IVF resusitation used to treat sepsis.  Would benefit from low dose IV Lasix x 1. Recent SBP this morning in the 120s. Will give 20 mg IV x 1 now. Monitor BP closely. Strict I/Os and daily weights.    For questions or updates, please contact [facility] Please consult [email or web address] for contact info under        Signed, Robbie LisBrittainy , PA-C  02/17/2018, 10:14 AM

## 2018-02-17 NOTE — Progress Notes (Addendum)
NAME:  Sholem Venhuizen Deeley [name]., MRN:  [id number], DOB:  [birth year], LOS: 2 ADMISSION DATE:  02/15/2018, CONSULTATION DATE: 02/15/2018 REFERRING MD: Debe Coder, MD, CHIEF COMPLAINT: Hypotension, altered mental status  Brief History   68 year old with hypertension, diabetes, CKD, coronary artery disease admitted with vomiting, diarrhea for the past 2 days.  Received 3 L of IV fluids in ED but continues to have low blood pressure.  Appears more confused.  PCCM consulted for evaluation.  History of present illness     Past Medical History  CKD stage III, hypertension, coronary artery disease status post CABG, GERD  Significant Hospital Events   2/9 Admit   Consults:  PCCM  Procedures:    Significant Diagnostic Tests:  Chest x-ray 02/16/2018 appears to have mild edema small effusion CT abd/pelvis 2/10>>> 1. No abnormality of the liver or biliary tract to explain abnormal LFTs. No biliary duct dilatation. Status post cholecystectomy.  2. Moderate bilateral pleural effusions with extensive clustered and consolidative airspace opacity. Findings are concerning for infection. 3. Nonspecific thickening of the urinary bladder, which is decompressed by Foley catheter. Correlate with urinalysis for evidence of infection. 4.  Trace nonspecific ascites in the low pelvis.  Micro Data:  Viral panel is negative 02/15/2018 blood cultures x2>>>  Antimicrobials:   Zithromax 02/15/2018 Zosyn 02/15/2018  Interim history/subjective:  NAD.  C/o some mild epigastric pain. Troponin rising 2.3->5.2->6.9.  Lactate cleared.   Objective   Blood pressure 101/68, pulse (!) 104, temperature 98.4 F (36.9 C), temperature source Oral, resp. rate 20, height 5\' 8"  (1.727 m), weight 75.2 kg, SpO2 90 %.        Intake/Output Summary (Last 24 hours) at 02/17/2018 1214 Last data filed at 02/17/2018 1019 Gross per 24 hour  Intake 777.82 ml  Output 2650 ml  Net -1872.18 ml   Filed Weights   02/16/18 0038 02/16/18  0500 02/17/18 0328  Weight: 78.9 kg 78.9 kg 75.2 kg    Examination: General: Confused male in no acute distress at rest  HEENT: No JVD lymphadenopathy is appreciated Neuro: awake and alert, intermittently confused    CV: s1s2 rrr  PULM: resps even non labored on [place], diminished bases  MH:DQQI, non-tender, bsx4 active  Extremities: warm/dry, 1+ edema  Skin: Pale   Resolved Hospital Problem list     Assessment & Plan:  68 year old with hypertension in the setting of nausea, vomiting, possible gastroenteritis CCM: For persistent hypotension  Hypotension/ shock of unclear etiology.  Initially thought to be septic with lactate 3 but no clear source of infection.  Lactate now cleared.  Troponin continues to rise.  ??cardiogenic.  Not requiring pressors.  PLAN -  Trend lactate  Continue empiric abx - would add Vanc given unclear source and POS MRSA swab  Follow cultures  Monitor closely with diuresis  Check pct   Elevated troponin -- continues to rise.  Some mild epigastric pain.  PLAN -  cardiology following  Continue heparin gtt  No acute w/u planned but may need to consider ongoing cardiac source of shock  ?PNA  PLAN -  Broad spectrum abx as above    Acute kidney injury PLAN -  Follow UOP  F/u chem  Monitor with diuresis   Elevated LFTs PLAN -  LFTs are normalizing on 02/16/2018 Follow   Altered mental status and encephalopathy - improved.  Supportive care  Monitor closely  Consider CT head if ongoing  UDS is positive for opiates but he does not use narcotics at  home.  No indication for ICU tx at this time.   Best practice:  Diet: NPO Pain/Anxiety/Delirium protocol (if indicated): NA VAP protocol (if indicated): NA DVT prophylaxis: NA GI prophylaxis: NA Glucose control: SSI coverage Mobility: Bed Code Status: Full Family Communication: no family at bedside 2/11 Disposition: SDU  Labs   CBC: Recent Labs  Lab 02/15/18 0230 02/16/18 0340  02/17/18 0013  WBC 10.9* 11.4* 14.2*  NEUTROABS 9.4*  --   --   HGB 13.9 10.9* 11.9*  HCT 45.0 33.6* 35.5*  MCV 94.7 93.1 91.0  PLT 191 155 187    Basic Metabolic Panel: Recent Labs  Lab 02/15/18 0230 02/15/18 1727 02/16/18 0340 02/17/18 0013  NA 142 140 138 136  K 4.6 4.8 4.2 3.9  CL 105 107 105 99  CO2 22 23 23 24   GLUCOSE 135* 146* 201* 238*  BUN 24* 25* 24* 23  CREATININE 1.70* 1.70* 1.83* 1.94*  CALCIUM 9.1 7.8* 7.7* 8.0*  MG  --   --   --  1.4*  PHOS  --   --   --  2.8   GFR: Estimated Creatinine Clearance: 34.8 mL/min (A) (by C-G formula based on SCr of 1.94 mg/dL (H)). Recent Labs  Lab 02/15/18 0230  02/15/18 1202 02/16/18 0340 02/16/18 0947 02/16/18 1529 02/16/18 1915 02/17/18 0013  WBC 10.9*  --   --  11.4*  --   --   --  14.2*  LATICACIDVEN  --    < > 1.6  --  3.3* 2.1* 1.6  --    < > = values in this interval not displayed.   Lactic Acid, Venous    Component Value Date/Time   LATICACIDVEN 1.6 02/16/2018 1915    Liver Function Tests: Recent Labs  Lab 02/15/18 0230 02/15/18 1727 02/16/18 0340  AST 132* 52* 38  ALT 104* 76* 62*  ALKPHOS 85 58 59  BILITOT 1.2 0.9 1.3*  PROT 6.7 5.7* 5.9*  ALBUMIN 3.5 2.8* 2.9*   No results for input(s): LIPASE, AMYLASE in the last 168 hours. No results for input(s): AMMONIA in the last 168 hours.  ABG    Component Value Date/Time   PHART 7.370 02/16/2018 0603   PCO2ART 37.2 02/16/2018 0603   PO2ART 220 (H) 02/16/2018 0603   HCO3 21.0 02/16/2018 0603   ACIDBASEDEF 3.4 (H) 02/16/2018 0603   O2SAT 98.1 02/16/2018 0603     Coagulation Profile: No results for input(s): INR, PROTIME in the last 168 hours.  Cardiac Enzymes: Recent Labs  Lab 02/15/18 0850 02/16/18 0943 02/16/18 1529 02/16/18 1915 02/17/18 0013  TROPONINI <0.03 0.98* 2.38* 5.24* 6.95*    HbA1C: Hgb A1c MFr Bld  Date/Time Value Ref Range Status  10/19/2016 04:07 AM 6.7 (H) 4.8 - 5.6 % Final    Comment:    (NOTE) Pre  diabetes:          5.7%-6.4% Diabetes:              >6.4% Glycemic control for   <7.0% adults with diabetes     CBG: Recent Labs  Lab 02/16/18 1651 02/16/18 2220 02/17/18 0350 02/17/18 0819 02/17/18 1158  GLUCAP 182* 217* 254* 274* 280*    Dirk DressKaty Whiteheart, NP 02/17/2018  12:14 PM Pager: [phone] or [phone]) [phone]  Attending Note:  68 year old male with gastroenteritis that was hypotensive.  No events overnight, much improved today.  On exam, lungs were clear.  I reviewed CXR myself, no acute disease noted, pulmonary  edema.  Discussed with PCCM-NP.  Hypotension:  - Trend lactate  - Continue abx  - F/U on cultures  Sepsis:  - Broad spectrum abx  - F/U on culture  Hypoxemia:  - Titrate O2 for sat of 88-92%  Troponin +:  - Cards following  - Heparin drip  PCCM will sign off, please call back if needed.  Patient seen and examined, agree with above note.  I dictated the care and orders written for this patient under my direction.  Alyson ReedyYacoub, Wesam G, MD [phone]

## 2018-02-18 DIAGNOSIS — R740 Nonspecific elevation of levels of transaminase and lactic acid dehydrogenase [LDH]: Secondary | ICD-10-CM

## 2018-02-18 LAB — BASIC METABOLIC PANEL
Anion gap: 14 (ref 5–15)
Anion gap: 19 — ABNORMAL HIGH (ref 5–15)
BUN: 18 mg/dL (ref 8–23)
BUN: 19 mg/dL (ref 8–23)
CO2: 21 mmol/L — ABNORMAL LOW (ref 22–32)
CO2: 26 mmol/L (ref 22–32)
Calcium: 7.9 mg/dL — ABNORMAL LOW (ref 8.9–10.3)
Calcium: 8 mg/dL — ABNORMAL LOW (ref 8.9–10.3)
Chloride: 96 mmol/L — ABNORMAL LOW (ref 98–111)
Chloride: 98 mmol/L (ref 98–111)
Creatinine, Ser: 1.5 mg/dL — ABNORMAL HIGH (ref 0.61–1.24)
Creatinine, Ser: 1.58 mg/dL — ABNORMAL HIGH (ref 0.61–1.24)
GFR calc Af Amer: 51 mL/min — ABNORMAL LOW (ref >60)
GFR calc Af Amer: 54 mL/min — ABNORMAL LOW (ref >60)
GFR calc non Af Amer: 47 mL/min — ABNORMAL LOW (ref >60)
GFR, EST NON AFRICAN AMERICAN: 44 mL/min — AB (ref >60)
GLUCOSE: 281 mg/dL — AB (ref 70–99)
Glucose, Bld: 219 mg/dL — ABNORMAL HIGH (ref 70–99)
Potassium: 3.6 mmol/L (ref 3.5–5.1)
Potassium: 4.1 mmol/L (ref 3.5–5.1)
Sodium: 136 mmol/L (ref 135–145)
Sodium: 138 mmol/L (ref 135–145)

## 2018-02-18 LAB — CBC
HCT: 35.1 % — ABNORMAL LOW (ref 39.0–52.0)
Hemoglobin: 11.4 g/dL — ABNORMAL LOW (ref 13.0–17.0)
MCH: 29.5 pg (ref 26.0–34.0)
MCHC: 32.5 g/dL (ref 30.0–36.0)
MCV: 90.9 fL (ref 80.0–100.0)
Platelets: 182 10*3/uL (ref 150–400)
RBC: 3.86 MIL/uL — ABNORMAL LOW (ref 4.22–5.81)
RDW: 13.8 % (ref 11.5–15.5)
WBC: 9.3 10*3/uL (ref 4.0–10.5)
nRBC: 0 % (ref 0.0–0.2)

## 2018-02-18 LAB — PROCALCITONIN: Procalcitonin: 1.68 ng/mL

## 2018-02-18 LAB — GLUCOSE, CAPILLARY
Glucose-Capillary: 246 mg/dL — ABNORMAL HIGH (ref 70–99)
Glucose-Capillary: 247 mg/dL — ABNORMAL HIGH (ref 70–99)
Glucose-Capillary: 162 mg/dL — ABNORMAL HIGH (ref 70–99)
Glucose-Capillary: 189 mg/dL — ABNORMAL HIGH (ref 70–99)

## 2018-02-18 LAB — TROPONIN I: Troponin I: 2.5 ng/mL (ref <0.03)

## 2018-02-18 LAB — HEPARIN LEVEL (UNFRACTIONATED)
Heparin Unfractionated: 0.24 IU/mL — ABNORMAL LOW (ref 0.30–0.70)
Heparin Unfractionated: 0.27 IU/mL — ABNORMAL LOW (ref 0.30–0.70)
Heparin Unfractionated: 0.5 IU/mL (ref 0.30–0.70)

## 2018-02-18 LAB — MAGNESIUM: Magnesium: 1.8 mg/dL (ref 1.7–2.4)

## 2018-02-18 LAB — PHOSPHORUS: Phosphorus: 2.1 mg/dL — ABNORMAL LOW (ref 2.5–4.6)

## 2018-02-18 MED ORDER — HEPARIN BOLUS VIA INFUSION
1000.0000 [IU] | Freq: Once | INTRAVENOUS | Status: AC
  Administered 2018-02-18: 1000 [IU] via INTRAVENOUS
  Filled 2018-02-18: qty 1000

## 2018-02-18 MED ORDER — FUROSEMIDE 10 MG/ML IJ SOLN
20.0000 mg | Freq: Once | INTRAMUSCULAR | Status: AC
  Administered 2018-02-18: 20 mg via INTRAVENOUS
  Filled 2018-02-18: qty 2

## 2018-02-18 MED ORDER — FUROSEMIDE 10 MG/ML IJ SOLN: 40.0000 mg | Freq: Once | INTRAMUSCULAR | Status: DC

## 2018-02-18 NOTE — Progress Notes (Signed)
Writer notified by Tele that patient has returned to NSR with HR in the 90's.

## 2018-02-18 NOTE — Progress Notes (Signed)
ANTICOAGULATION CONSULT NOTE - Follow-Up Consult  Pharmacy Consult for Heparin Indication: chest pain/ACS  Allergies  Allergen Reactions  . Codeine     GI upset    Patient Measurements: Height: 5\' 8"  (172.7 cm)(will verify) Weight: 167 lb 12.3 oz (76.1 kg) IBW/kg (Calculated) : 68.4 Heparin Dosing Weight: 75.2kg  Vital Signs: Temp: 97.5 F (36.4 C) (02/12 0453) Temp Source: Oral (02/12 0453) BP: 102/62 (02/12 0453) Pulse Rate: 92 (02/12 0638)  Labs: Recent Labs    02/16/18 0340  02/16/18 1915 02/17/18 0013  02/17/18 1528 02/17/18 2340 02/18/18 0914  HGB 10.9*  --   --  11.9*  --   --  11.4*  --   HCT 33.6*  --   --  35.5*  --   --  35.1*  --   PLT 155  --   --  187  --   --  182  --   HEPARINUNFRC  --   --   --  0.58   < > 0.24* 0.24* 0.27*  CREATININE 1.83*  --   --  1.94*  --   --  1.58* 1.50*  TROPONINI  --    < > 5.24* 6.95*  --   --   --  2.50*   < > = values in this interval not displayed.    Estimated Creatinine Clearance: 45 mL/min (A) (by C-G formula based on SCr of 1.5 mg/dL (H)).   Medical History: Past Medical History:  Diagnosis Date  . Diabetes mellitus (HCC)    Assessment: 26 YOM admitted on 2/9 with possible sepsis, now with troponin elevation, pharmacy is consulted to start IV heparin.   Heparin level = 0.27, SUBtherapeutic despite a rate increased this morning to 1350 units/hr . CBC stable - no bleeding noted. Will increase heparin rate and recheck a level in 6-8 hours.   Goal of Therapy:  Heparin level 0.3-0.7 units/ml Monitor platelets by anticoagulation protocol: Yes   Plan:  Give heparin 1000 units IV bolus x1 Increase Heparin to 1500 units/hr Re-check heparin level in 8 hours  Noah Delaine, [place]  Clinical Pharmacist [phone] Please check AMION for all [facility] Pharmacy phone numbers After 10:00 PM, call Main Pharmacy [phone] 02/18/2018 12:23 PM

## 2018-02-18 NOTE — Progress Notes (Signed)
Progress Note  Patient Name: Philip [name]. Date of Encounter: 02/18/2018  Primary Cardiologist:  Dr. Newt Lukes, [facility]  Subjective   Remains confused but same as yesterday. Unsure of patient's actual baseline. No family present to assist. He is A&O to person but not place or time.   Inpatient Medications    Scheduled Meds: . aspirin EC  81 mg Oral Daily  . Chlorhexidine Gluconate Cloth  6 each Topical Q0600  . citalopram  20 mg Oral Daily  . clopidogrel  75 mg Oral Daily  . furosemide  20 mg Intravenous Once  . insulin aspart  0-15 Units Subcutaneous TID WC  . insulin aspart  0-5 Units Subcutaneous QHS  . insulin aspart  3 Units Subcutaneous TID WC  . insulin glargine  10 Units Subcutaneous QHS  . mouth rinse  15 mL Mouth Rinse BID  . mupirocin ointment  1 application Nasal BID   Continuous Infusions: . ceFEPime (MAXIPIME) IV 2 g (02/18/18 0849)  . heparin 1,350 Units/hr (02/18/18 0127)  . [START ON 02/19/2018] vancomycin     PRN Meds: acetaminophen **OR** acetaminophen, LORazepam   Vital Signs    Vitals:   02/18/18 0453 02/18/18 0500 02/18/18 0600 02/18/18 0638  BP: 102/62     Pulse: (!) 104 (!) 102 (!) 107 92  Resp: (!) 26 (!) 25 (!) 23 20  Temp: (!) 97.5 F (36.4 C)     TempSrc: Oral     SpO2: (!) 88% 90% (!) 89% 94%  Weight:  76.1 kg    Height:        Intake/Output Summary (Last 24 hours) at 02/18/2018 1129 Last data filed at 02/18/2018 0500 Gross per 24 hour  Intake 1090 ml  Output 500 ml  Net 590 ml   Last 3 Weights 02/18/2018 02/17/2018 02/16/2018  Weight (lbs) 167 lb 12.3 oz 165 lb 12.6 oz 173 lb 15.1 oz  Weight (kg) 76.1 kg 75.2 kg 78.9 kg      Telemetry    NSR, occasional PVCs, no recurrent NSVT - Personally Reviewed  ECG    Not performed today - Personally Reviewed  Physical Exam   GEN: late middle aged WM, confused but in no acute distress.   Neck: No JVD Cardiac: RRR, no murmurs, rubs, or gallops.  Respiratory: Clear to auscultation  bilaterally. GI: Soft, nontender, non-distended  MS: No edema; No deformity. Neuro:  Nonfocal  Psych: Normal affect   Labs    Chemistry Recent Labs  Lab 02/15/18 0230 02/15/18 1727 02/16/18 0340 02/17/18 0013 02/17/18 2340 02/18/18 0914  NA 142 140 138 136 138 136  K 4.6 4.8 4.2 3.9 3.6 4.1  CL 105 107 105 99 98 96*  CO2 22 23 23 24 26  21*  GLUCOSE 135* 146* 201* 238* 219* 281*  BUN 24* 25* 24* 23 18 19   CREATININE 1.70* 1.70* 1.83* 1.94* 1.58* 1.50*  CALCIUM 9.1 7.8* 7.7* 8.0* 7.9* 8.0*  PROT 6.7 5.7* 5.9*  --   --   --   ALBUMIN 3.5 2.8* 2.9*  --   --   --   AST 132* 52* 38  --   --   --   ALT 104* 76* 62*  --   --   --   ALKPHOS 85 58 59  --   --   --   BILITOT 1.2 0.9 1.3*  --   --   --   GFRNONAA 40* 40* 37* 34* 44* 47*  GFRAA  47* 47* 43* 40* 51* 54*  ANIONGAP 15 10 10 13 14  19*     Hematology Recent Labs  Lab 02/16/18 0340 02/17/18 0013 02/17/18 2340  WBC 11.4* 14.2* 9.3  RBC 3.61* 3.90* 3.86*  HGB 10.9* 11.9* 11.4*  HCT 33.6* 35.5* 35.1*  MCV 93.1 91.0 90.9  MCH 30.2 30.5 29.5  MCHC 32.4 33.5 32.5  RDW 14.2 14.1 13.8  PLT 155 187 182    Cardiac Enzymes Recent Labs  Lab 02/16/18 0943 02/16/18 1529 02/16/18 1915 02/17/18 0013  TROPONINI 0.98* 2.38* 5.24* 6.95*   No results for input(s): TROPIPOC in the last 168 hours.   BNP Recent Labs  Lab 02/15/18 0230  BNP 124.5*     DDimer No results for input(s): DDIMER in the last 168 hours.   Radiology    Ct Abdomen Pelvis W Contrast  Result Date: 02/16/2018 CLINICAL DATA:  Abnormal liver function tests EXAM: CT ABDOMEN AND PELVIS WITH CONTRAST TECHNIQUE: Multidetector CT imaging of the abdomen and pelvis was performed using the standard protocol following bolus administration of intravenous contrast. CONTRAST:  75mL OMNIPAQUE IOHEXOL 300 MG/ML  SOLN COMPARISON:  04/30/2006 FINDINGS: Lower chest: Moderate bilateral pleural effusions with extensive clustered and consolidative airspace opacity.  Hepatobiliary: No focal liver abnormality is seen. Status post cholecystectomy. No biliary dilatation. Pancreas: Unremarkable. No pancreatic ductal dilatation or surrounding inflammatory changes. Spleen: Normal in size without focal abnormality. Adrenals/Urinary Tract: Adrenal glands are unremarkable. Kidneys are normal, without renal calculi, focal lesion, or hydronephrosis. Thickening of the urinary bladder, which is decompressed by Foley catheter. Stomach/Bowel: Stomach is within normal limits. Appendix appears normal. No evidence of bowel wall thickening, distention, or inflammatory changes. Vascular/Lymphatic: Mixed calcific atherosclerosis. No enlarged abdominal or pelvic lymph nodes. Reproductive: No mass or other abnormality. Other: No abdominal wall hernia or abnormality. Trace ascites in the low pelvis. Musculoskeletal: No acute or significant osseous findings. IMPRESSION: 1. No abnormality of the liver or biliary tract to explain abnormal LFTs. No biliary duct dilatation. Status post cholecystectomy. 2. Moderate bilateral pleural effusions with extensive clustered and consolidative airspace opacity. Findings are concerning for infection. 3. Nonspecific thickening of the urinary bladder, which is decompressed by Foley catheter. Correlate with urinalysis for evidence of infection. 4.  Trace nonspecific ascites in the low pelvis. Electronically Signed   By: Lauralyn PrimesAlex  Bibbey M.D.   On: 02/16/2018 14:42   Dg Chest Port 1 View  Result Date: 02/17/2018 CLINICAL DATA:  68 year old male with history of hypotension. EXAM: PORTABLE CHEST 1 VIEW COMPARISON:  Chest x-ray 02/16/2018. FINDINGS: There is cephalization of the pulmonary vasculature, indistinctness of the interstitial markings, and patchy airspace disease throughout the lungs bilaterally suggestive of moderate pulmonary edema. Small bilateral pleural effusions. Heart size is mildly enlarged. Upper mediastinal contours are within normal limits. Aortic  atherosclerosis. Status post median sternotomy for CABG. IMPRESSION: 1. The appearance of the chest suggests worsening congestive heart failure, as above. Electronically Signed   By: Trudie Reedaniel  Entrikin M.D.   On: 02/17/2018 08:39    Cardiac Studies   2D Echo 02/15/18 IMPRESSIONS   1. The left ventricle has low normal systolic function of 50-55%.There is akinesis of the apical inferolateral, inferior, lateral, anterior and septal left ventricular segments. 2. The right ventricle has normal systolic function. The cavity was normal. There is no increase in right ventricular wall thickness. 3. The mitral valve is normal in structure. There is mild mitral annular calcification present. 4. The tricuspid valve is normal in structure. 5. The  aortic valve has an indeterminant number of cusps There is moderate thickening and mild calcification of the aortic valve, with moderately decreased cusp excursion. 6. The pulmonic valve was normal in structure.  Patient Profile     68 y.o. male w/ h/o CAD, followed at [facility], s/p CABG in 2015, post operative atrial fibrillation, HTN, HLD, Diabetes, CKD and prior CVA, admitted for septic shock of unknown source. Cardiology was consulted for elevated troponin. Also noted to have NSVT on tele.   Assessment & Plan    1. Septic Shock: presented w/ n/v/d and AMS. Hypotensive in the ED w/ SBPs in the 70s and febrile w/ temp of 101. Lactic acid up to 3.3.  Started on broad spectrum antibiotics and IVF resuscitation. He has not required pressors. Blood cultures negative to date. Abdominal /Pelvic CT and UA negative.  CXR showed mild pulmonary edema but no infiltrates. Respiratory panel negative. Flu negative. Strep and legionella negative. Further w/u per primary team. Per notes in care everywhere, he does have a h/o diabetic foot ulcers in the past. Needs full body check.    2. Elevated Troponin: significant elevation with upward trend, 2.38>>5.24>>6.95. EKg shows  sinus rhythm, anteroseptal Q waves, non acute EKG. Pt with no recent h/o CP, however he is diabetic, thus ? Silent ischemia. This is also in the setting of septic shock of unknown source.  Echo done on 2/9 showed preserved LVEF but akinesis of the apical inferolateral, inferior, lateral, anterior and septal left ventricular segments. Unsure if WMAs are new or old. He had an echo at [facility] in 2018 with normal EF but no mention of wall motion in report. For now, recommend continuation of IV heparin and medical management of known CAD. Further inpatient work-up, including possible invasive cath to be determined based upon clinical course of sepsis and results of blood cultures.  Given his current AMS (alert to person but not place and time), he may need family to consent to cath.   3. CAD: underwent coronary angiography in 01/2012, which revealed severe 3-vessel coronary disease. Preserved left ventricular function at that time. He subsequently did undergo coronary artery bypass graft surgery in August 2015 with placement of a left internal mammary graft to the LAD, saphenous vein graft to the obtuse marginal, saphenous vein graft to the posterior descending artery. Has since been followed by cardiology at [facility]. Denies any recent CP but trops elevated as outlined above concerning for ACS. ? Silent ischemia given DM. May need cath at some point once more medically stable. Continue IV heparin for now + ASA and Plavix. No statin ordered. LFTs were initially elevated but trending downward. Will plan to resume at some point. Holding PO ? blockers due to soft BP.   4. NSVT: 16 beat run noted on tele yesterday but asymptomatic. Echo shows normal LVEF. No recurrence detected on tele today. Continue to monitor on tele. Keep K >4.0 and Mg >2.0.  5. CKD: baseline SCr ~1.2. SCr was 1.7 on admit but trended upward,  at 1.94 yesterday. Suspect likely prerenal from hypotension from sepsis resulting in renal hypoperfusion. He got  IVF resuscitation then he required lasix yesterday for volume overload and SCr improved today at 1.5. Continue to monitor.   6. DM: management per IM.   7. HFpEF: admit BNP 124.5. Initial CXR showed mild pulmonary edema. Repeat CXR yesterday showed worsening CHF. Echo with normal EF at 50-55%. felt to be volume overloaded yesterday. Suspect some of volume overload was likely secondary  to aggressive IVF resusitation used to treat sepsis.  He got IV Lasix and improved. Continue to monitor.   For questions or updates, please contact [facility] Please consult [email or web address] for contact info under        Signed, Robbie LisBrittainy Simmons, PA-C  02/18/2018, 11:29 AM

## 2018-02-18 NOTE — Discharge Summary (Signed)
Name: Philip [name]. MRN: [id number] DOB: [birth year] 68 y.o. PCP: System, Pcp Not In  Date of Admission: 02/15/2018  1:50 AM Date of Discharge:  Attending Physician: Earl Lagos, MD  Discharge Diagnosis: 1. Septic shock 2/2 unknown source, possibly pneumonia 2. NSTEMI 3. AKI 4. HLD 5. Elevated transaminitis 6. 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 Expressive aphasia  Discharge Medications: Allergies as of 02/25/2018      Reactions   Codeine    GI upset      Medication List    STOP taking these medications   amLODipine 5 MG tablet Commonly known as:  NORVASC   esomeprazole 40 MG capsule Commonly known as:  NEXIUM   multivitamin with minerals Tabs tablet     TAKE these medications   aspirin EC 81 MG tablet Take 81 mg by mouth daily.   atorvastatin 80 MG tablet Commonly known as:  LIPITOR Take 1 tablet (80 mg total) by mouth daily at 6 PM.   calcium carbonate 500 MG chewable tablet Commonly known as:  TUMS - dosed in mg elemental calcium Chew 2 tablets by mouth as needed for indigestion or heartburn.   citalopram 20 MG tablet Commonly known as:  CELEXA Take 20 mg by mouth daily.   clopidogrel 75 MG tablet Commonly known as:  PLAVIX Take 75 mg by mouth daily.   insulin NPH Human 100 UNIT/ML injection Commonly known as:  HUMULIN N Inject 0.05 mLs (5 Units total) into the skin 2 (two) times daily before a meal. What changed:  how much to take   isosorbide mononitrate 30 MG 24 hr tablet Commonly known as:  IMDUR Take 1 tablet (30 mg total) by mouth daily. Start taking on:  February 26, 2018   metFORMIN 1000 MG tablet Commonly known as:  GLUCOPHAGE Take 1,000 mg by mouth 2 (two) times daily with a meal.   Metoprolol Tartrate 37.5 MG Tabs Take 37.5 mg by mouth 2 (two) times daily. What changed:    medication strength  how much to take   nitroGLYCERIN 0.4 MG/SPRAY spray Commonly known as:  NITROLINGUAL Place 1  spray under the tongue every 5 (five) minutes x 3 doses as needed for chest pain.       Disposition and follow-up:   Mr.Philip R Waymire Jr. was discharged from [facility] in Stable condition.  At the hospital follow up visit please address:  1.  Septic shock 2/2 unknown source, resolved, please recheck labs.  NTEMI: Started on Lipitor 80 mg daily, and imdur 30 mg daily. Please make sure that he is taking his ASA, plavix and lopressor 37.5 mg BID. Please make sure that he has follow up with cardiology.   2.  Labs / imaging needed at time of follow-up: CBC, BMP, lipid panel (LDL goal <70)  3.  Pending labs/ test needing follow-up: None  Follow-up Appointments:  Contact information for follow-up providers    [facility]. Schedule an appointment as soon as possible for a visit in 1 week(s).   Contact information: [phone] [address] [place], [place] [address]       Ileana Roup Gandhi,MD. Schedule an appointment as soon as possible for a visit.   Why:  Please make an appointment in 1-2 weeks Contact information: Cleotis Lema, MD (Mar. 03, 2017March 03, 2017 - Present) [phone] (Work) [phone] [facility]) [address] [place], [place] [address] Cardiology  Contact information for after-discharge care    Destination    [facility] .   Service:  Skilled Nursing Contact information: [address] [place] [address] [phone]                  Hospital Course by problem list: 1. Septic shock 2/2 unknown source, possibly pneumonia: This is a 68 year old male with a history of CAD s/p CABG, MCA CVA, DMII, HTN, andCKD who presented with a 1 day history of n/v, diarrhea, confusion and lethargy. Found to be hypotensive with a SBP of 70 and hypoglycemic with a CBG of 69. He was found to be febrile to 101, and hypotensive. He was fluid resuscitated however SBPs  still remained low. PCCM was consulted and his blood pressures had improved slightly and they recommended management on the floor. CXR showed cardiomegaly and mild interstitial edema, no consolidation seen.  Leukocytosis to 10, LA 3.2. He was treated with Zosyn and azithromycin initially. Blood cultures were negative. Respiratory cultures were negative.  Skin exam was unremarkable.  CT abdomen was unremarkable, no source of infection was identified.  Repeat chest x-ray showed worsening pulmonary edema and had bibasilar crackles on exam, he was given some diuresis with Lasix due to worsening shortness of breath. He had gradual improvement in his mental status and blood pressures. He was treated with a 7 day course of antibiotics. He remained afebrile and leukocytosis resolved.  2. NSTEMI: He is on Plavix and aspirin at home, has a history of CAD status post CABG.  Elevated troponin in setting of septic shock, up to 6.9. He denied any chest pain during his admission.  He was started on heparin drip and cardiology was consulted.  Given the significant elevation in his troponin there is concern for non-STEMI.  Echocardiogram showed low/normal EF 50-55%, akinesis of the apical inferolateral, inferior, lateral, anterior and septal left ventricular segments.  Once he was stable he had a cath that showed severe multivessel coronary artery disease with severe diffuse stenosis of the LAD, severe stenosis of the ramus intermedius branch, severe stenosis of the circumflex, and moderately severe stenosis of the distal RCA.  They recommended medical management given his other comorbidities. He was started on imdur 30 mg daily, and metoprolol was increased to 37.5 mg BID. Continue ASA and plavix.   3. AKI: Cr increased to 1.7, base line around 1.1.  He was fluid resuscitated and his AKI resolved. No further work up.   4. HLD: He was continued on Lipitor 80 mg daily.   5. Elevated transaminitis: AST 132, ALT 104 on  admission, thought to be related to shock liver. Hepatic enzymes returned to normal.   6. NSVT: Patient remains asymptomatic, he has had episodes of SVT during his admission.  Initially had not been on his beta-blockers and being fluid resuscitated, and may have had some fluid overload.  His home metoprolol was restarted and the episodes decreased.  6. 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  He presented with worsening confusion and difficulty with his own name. Thought to be due to worsening hypotension from septic shock. Slowly improved and by discharge he was able to provide his name and sometimes the year.  Discharge Vitals:   BP 128/76 (BP Location: Right Arm)   Pulse 67   Temp (!) 97.4 F (  36.3 C) (Oral)   Resp 20   Ht 5\' 8"  (1.727 m) Comment: will verify  Wt 78.4 kg   SpO2 96%   BMI 26.28 kg/m   Pertinent Labs, Studies, and Procedures:  CBC Latest Ref Rng & Units 02/24/2018 02/23/2018 02/22/2018  WBC 4.0 - 10.5 K/uL 6.9 7.9 6.3  Hemoglobin 13.0 - 17.0 g/dL 10.6(L) 10.8(L) 10.8(L)  Hematocrit 39.0 - 52.0 % 32.8(L) 33.6(L) 33.0(L)  Platelets 150 - 400 K/uL 235 233 215   BMP Latest Ref Rng & Units 02/24/2018 02/23/2018 02/22/2018  Glucose 70 - 99 mg/dL 161(W165(H) 960(A243(H) 90  BUN 8 - 23 mg/dL 12 13 13   Creatinine 0.61 - 1.24 mg/dL 5.400.99 9.810.99 1.910.98  Sodium 135 - 145 mmol/L 138 137 139  Potassium 3.5 - 5.1 mmol/L 3.9 4.0 4.0  Chloride 98 - 111 mmol/L 103 103 105  CO2 22 - 32 mmol/L 25 26 26   Calcium 8.9 - 10.3 mg/dL 4.7(W8.8(L) 2.9(F8.5(L) 8.3(L)   US abdomen 02/15/18: IMPRESSION: Hyperechoic hepatic parenchyma, nonspecific but raising the possibility of hepatic steatosis.  Possible mild intrahepatic ductal dilatation. However, the common duct is nondilated.  No focal hepatic lesion is evident on ultrasound.  CT abdomen/pelvis  02/16/18: IMPRESSION: 1. No abnormality of the liver or biliary tract to explain abnormal LFTs. No biliary duct dilatation. Status post cholecystectomy.  2. Moderate bilateral pleural effusions with extensive clustered and consolidative airspace opacity. Findings are concerning for infection.  3. Nonspecific thickening of the urinary bladder, which is decompressed by Foley catheter. Correlate with urinalysis for evidence of infection.  4.  Trace nonspecific ascites in the low pelvis.   Echocardiogram 02/15/18: IMPRESSIONS    1. The left ventricle has low normal systolic function of 50-55%.There is akinesis of the apical inferolateral, inferior, lateral, anterior and septal left ventricular segments.  2. The right ventricle has normal systolic function. The cavity was normal. There is no increase in right ventricular wall thickness.  3. The mitral valve is normal in structure. There is mild mitral annular calcification present.  4. The tricuspid valve is normal in structure.  5. The aortic valve has an indeterminant number of cusps There is moderate thickening and mild calcification of the aortic valve, with moderately decreased cusp excursion.  6. The pulmonic valve was normal in structure.  Left heart cath 02/23/18: 1.  Severe multivessel coronary artery disease with severe diffuse stenosis of the LAD, severe stenosis of the ramus intermedius branch, severe stenosis of the circumflex, and moderately severe stenosis of the distal RCA 2.  Status post aortocoronary bypass surgery with occlusion of the LIMA to LAD graft before the insertion in the LAD, wide patency of the saphenous vein graft to OM, and wide patency of the saphenous vein graft to PDA 3.  Severely elevated LVEDP  Recommendations: The patient had significant troponin elevation in the context of septic shock.  He has severe multivessel coronary disease with a diffuse disease pattern, heavy calcification, and no clear  culprit lesion to account for his non-STEMI.  I suspect he had diffuse myocardial ischemia in the setting of sepsis.  Considering his cognitive issues, diffuse disease pattern, and poor functional status, it may be most appropriate to treat him medically as a first-line approach.  Discharge Instructions: Discharge Instructions    Call MD for:  difficulty breathing, headache or visual disturbances   Complete by:  As directed    Call MD for:  extreme fatigue   Complete by:  As directed  Call MD for:  hives   Complete by:  As directed    Call MD for:  persistant dizziness or light-headedness   Complete by:  As directed    Call MD for:  persistant nausea and vomiting   Complete by:  As directed    Call MD for:  redness, tenderness, or signs of infection (pain, swelling, redness, odor or green/yellow discharge around incision site)   Complete by:  As directed    Call MD for:  severe uncontrolled pain   Complete by:  As directed    Call MD for:  temperature >100.4   Complete by:  As directed    Diet - low sodium heart healthy   Complete by:  As directed    Discharge instructions   Complete by:  As directed    Brynda PeonHelmut R Mullenbach Jr.,   It has been a pleasure working with you and we are glad you're feeling better. You were hospitalized for sepsis caused by an infection, your blood pressure was very low and we treated you with antibiotics. It looks like you may have had a pneumonia. We strongly recommended that you go to a rehab, it would have been safer however you decided to return home. We have set up home health therapy to help you work on your strength.   You were also noted to have some heart damage, this was likely related to your low blood pressure.  Please STOP taking your losartan for now, please follow up with your PCP to see if this needs to be restarted.   Please continue to take your aspirin and plavix.  Please START taking Lipitor 80 mg daily Increase your Lopressor to 37.5 mg  twice a day Please START taking Imdur 30 mg daily Please follow up with cardiology in 1-2 weeks  Follow up with your primary care provider in 1-2 weeks  If your symptoms worsen or you develop new symptoms, please seek medical help whether it is your primary care provider or emergency department.  If you have any questions about this hospitalization please call [phone].   Increase activity slowly   Complete by:  As directed       Signed: Claudean SeveranceKrienke, Dionisia Pacholski M, MD 02/25/2018, 1:45 PM   Pager: [phone]

## 2018-02-18 NOTE — Progress Notes (Deleted)
ANTICOAGULATION CONSULT NOTE - Follow-Up Consult  Pharmacy Consult for Heparin Indication: chest pain/ACS  Allergies  Allergen Reactions  . Codeine     GI upset    Patient Measurements: Height: 5\' 8"  (172.7 cm)(will verify) Weight: 167 lb 12.3 oz (76.1 kg) IBW/kg (Calculated) : 68.4 Heparin Dosing Weight: 75.2kg  Vital Signs: Temp: 97.9 F (36.6 C) (02/12 2045) Temp Source: Oral (02/12 2045) BP: 118/73 (02/12 1958) Pulse Rate: 93 (02/12 1958)  Labs: Recent Labs    02/16/18 0340  02/16/18 1915 02/17/18 0013  02/17/18 2340 02/18/18 0914 02/18/18 2125  HGB 10.9*  --   --  11.9*  --  11.4*  --   --   HCT 33.6*  --   --  35.5*  --  35.1*  --   --   PLT 155  --   --  187  --  182  --   --   HEPARINUNFRC  --   --   --  0.58   < > 0.24* 0.27* 0.50  CREATININE 1.83*  --   --  1.94*  --  1.58* 1.50*  --   TROPONINI  --    < > 5.24* 6.95*  --   --  2.50*  --    < > = values in this interval not displayed.    Estimated Creatinine Clearance: 45 mL/min (A) (by C-G formula based on SCr of 1.5 mg/dL (H)).   Assessment: 33 YOM admitted on 2/9 with possible sepsis, now with troponin elevation, pharmacy is consulted to dose IV heparin.   Heparin level is therapeutic; no bleeding reported.  Goal of Therapy:  Heparin level 0.3-0.7 units/ml Monitor platelets by anticoagulation protocol: Yes   Plan:  Continue heparin gtt at 1500 units/hr F/U AM labs   Velmer Woelfel D. Laney Potash, PharmD, BCPS, BCCCP 02/18/2018, 10:14 PM

## 2018-02-18 NOTE — Progress Notes (Signed)
Spoke with Aram Beecham, patient daughter and updated her patient's progress.  She does report that ever since patient stroke 3 years ago she he has been having difficulty with expressing himself.  States that he gets very confused when you talk to him and currently appears like he knows what he wants to say but he was unable to the words.  He will get very frustrated about not being able to find the words.  I discussed how he presented today who reported that this was consistent with his baseline.  He lives with his second wife right now and their grandson.  She reported that her mother, patient's ex-wife would be coming into the hospital tomorrow.

## 2018-02-18 NOTE — Progress Notes (Signed)
Subjective:  Overnight Mr. Milbourn did not have any acute episodes of hypoxia, he is on high flow nasal cannula 6 L. This morning he denies shortness of breath. He continues to endorse mild abdominal pain. He is able to state the correct location and year today. Overall, Mr. Yanda seems more lucid today and is able to talk about his family with the medical team. All of his questions were answered.  Objective:  Vital signs in last 24 hours: Vitals:   02/18/18 0453 02/18/18 0500 02/18/18 0600 02/18/18 0638  BP: 102/62     Pulse: (!) 104 (!) 102 (!) 107 92  Resp: (!) 26 (!) 25 (!) 23 20  Temp: (!) 97.5 F (36.4 C)     TempSrc: Oral     SpO2: (!) 88% 90% (!) 89% 94%  Weight:  76.1 kg    Height:        General: lying in bed, no distress Cardiac: RRR, no m/r/g Pulmonary: Bibasilar crackles, normal work of breathing, no wheezing Abdomen: Soft, non-tender Extremity: Warm extremities, no LE edema Psychiatry: Alert, oriented x3    Assessment/Plan:  Active Problems:   Transaminitis   Shock (HCC)   Severe sepsis (HCC)   Acute respiratory failure with hypoxia (HCC)   Sepsis (HCC)   Hypotension   Hypoxemia   Sinus tachycardia This is a 68 year old male with a history of CAD s/p CABG, MCA CVA, DMII, HTN, andCKD who presented with a 1 day history of n/v, diarrhea, confusion and lethargy. Found to be hypotensive with a SBP of 70 and hypoglycemic with a CBG of 69. He was found to be febrile to 101, and hypotensive. He was fluid resuscitated however SBPs still remained low. Started on zosyn and azithromycin.  Severe hypotension 2/2emesis: Shock, likely septic 2/2 unknown source: -Blood pressure has improved however he is still having some episodes of hypotension.  Blood cultures have been NGTD.  His leukocytosis has improved and he has remained afebrile.  Lactic acid resolved CCM recommended switching antibiotics to Vanco and cefepime and this was done yesterday.  He was given some mild  diuresis with 20 mg IV Lasix yesterday however net output is still +500 cc.  Source of infection is still unknown however he has been maintaining a MAP greater than 65.  It is possible that he did have a component of cardiogenic shock given his elevated troponin.  -Continue vancomycin and cefepime -F/u blood cultures, they have been NGTD -Continue [place], wean as tolerated -Tylenol PRN for fevers -Imodium for diarrhea -Lasix 20 mg IV today, monitor I+Os -Attempted to contact wife however was unable to get in touch with her, will try again today  SVT: -No episodes of SVT overnight. -Continue telemetry -Blood pressures have still been low we will beta-blockers for hypotension  ?Anxiety: Patient has been having these episodes of diaphoresis, hypoxia, and tachycardia.  He will have complaints of shortness of breath and some anxiety and agitation.  This may be all related to his pulmonary edema and episodes of SVT however possible that a anxiety attack may be contributing.  We will do a trial of Ativan as needed to see if this may help.  He has not used any Ativan and did not seem to have any episodes of anxiety overnight.  -Continue Ativan 1 mg as needed for anxiety  AKI: -Improving today, Cr at 1.58 from 1.94 yesterday.  -Continue to follow BMP  H/o CAD s/p CABG: Troponinemia: -On plavix and ASA at  home, patient reported that he had been taking this. He has been having elevated troponin, from 2.38 to 6.95 overnight. He was started on a heparin drip and cardiology was consulted. He has not been having chest pain but has been having episodes of shortness of breath that may just be related to his pulmonary edema seen on CXR.   -Continue Plavix and ASA -Cardiology following, appreciate recommendations -Once stable from infection they would consider cath -Conintue IV heparin -Trend troponins to ensure that it has peaked, will continue heparin drip for 48 hr after peaked  troponin  Diabetes: -On insulin and metformin at home.  -ContinueSSI -Frequent CBGs  FEN:No fluids, replete lytes prn, heart healthy VTE ppx: Lovenox  Code Status: FULL   Dispo: Anticipated discharge is pending clinical improvement.   Claudean Severance, MD 02/18/2018, 6:54 AM Pager: [phone]

## 2018-02-18 NOTE — Progress Notes (Signed)
ANTICOAGULATION CONSULT NOTE - Follow-Up Consult  Pharmacy Consult for Heparin Indication: chest pain/ACS  Allergies  Allergen Reactions  . Codeine     GI upset    Patient Measurements: Height: 5\' 8"  (172.7 cm)(will verify) Weight: 165 lb 12.6 oz (75.2 kg) IBW/kg (Calculated) : 68.4 Heparin Dosing Weight: 75.2kg  Vital Signs: Temp: 98.4 F (36.9 C) (02/11 2359) Temp Source: Oral (02/11 2359) BP: 111/75 (02/12 0000) Pulse Rate: 95 (02/12 0000)  Labs: Recent Labs    02/16/18 0340  02/16/18 1529 02/16/18 1915  02/17/18 0013 02/17/18 0741 02/17/18 1528 02/17/18 2340  HGB 10.9*  --   --   --   --  11.9*  --   --  11.4*  HCT 33.6*  --   --   --   --  35.5*  --   --  35.1*  PLT 155  --   --   --   --  187  --   --  182  HEPARINUNFRC  --   --   --   --    < > 0.58 0.26* 0.24* 0.24*  CREATININE 1.83*  --   --   --   --  1.94*  --   --  1.58*  TROPONINI  --    < > 2.38* 5.24*  --  6.95*  --   --   --    < > = values in this interval not displayed.    Estimated Creatinine Clearance: 42.7 mL/min (A) (by C-G formula based on SCr of 1.58 mg/dL (H)).   Medical History: Past Medical History:  Diagnosis Date  . Diabetes mellitus (HCC)    Assessment: 18 YOM admitted on 2/9 with possible sepsis, now with troponin elevation, pharmacy is consulted to start IV heparin.   Heparin level this afternoon remains SUBtherapeutic despite a rate increase this morning (HL 0.24 << 0.26, goal of 0.3-0.7). RN noted drip beeping and/or paused ~30 minutes prior to level drawn this afternoon - so could be falsely low due to this. CBC stable - no bleeding noted. Will increase slightly and recheck a level in 6 hours.   2/12 AM update: heparin level remains low, no issues per RN.  Goal of Therapy:  Heparin level 0.3-0.7 units/ml Monitor platelets by anticoagulation protocol: Yes   Plan:  - Increase Heparin to 1350 units/hr - Re-check heparin level in 8 hours  Abran Duke, PharmD,  BCPS Clinical Pharmacist Phone: [phone]

## 2018-02-18 NOTE — Progress Notes (Signed)
Writer notified by tele that patient HR reached 160 and pt went from NSR to A-fib. Pt has slight tachypnea but BP is stable at 112/78. Pt has no complaints of chest pain or dizziness. PA Simmons just paged. Continuing to monitor.

## 2018-02-18 NOTE — Progress Notes (Signed)
Writer assessed patient to find increased echymosis around right eye. Pt is on a heparin drip. MD Nedra Hai made aware. Continuing to monitor throughout shift and will notify evening nurse to monitor as well.

## 2018-02-18 NOTE — Progress Notes (Signed)
ANTICOAGULATION CONSULT NOTE - Follow-Up Consult  Pharmacy Consult for Heparin Indication: chest pain/ACS  Allergies  Allergen Reactions  . Codeine     GI upset    Patient Measurements: Height: 5\' 8"  (172.7 cm)(will verify) Weight: 167 lb 12.3 oz (76.1 kg) IBW/kg (Calculated) : 68.4 Heparin Dosing Weight: 75.2kg  Vital Signs: Temp: 97.9 F (36.6 C) (02/12 2045) Temp Source: Oral (02/12 2045) BP: 118/73 (02/12 1958) Pulse Rate: 93 (02/12 1958)  Labs: Recent Labs    02/16/18 0340  02/16/18 1915 02/17/18 0013  02/17/18 2340 02/18/18 0914 02/18/18 2125  HGB 10.9*  --   --  11.9*  --  11.4*  --   --   HCT 33.6*  --   --  35.5*  --  35.1*  --   --   PLT 155  --   --  187  --  182  --   --   HEPARINUNFRC  --   --   --  0.58   < > 0.24* 0.27* 0.50  CREATININE 1.83*  --   --  1.94*  --  1.58* 1.50*  --   TROPONINI  --    < > 5.24* 6.95*  --   --  2.50*  --    < > = values in this interval not displayed.    Estimated Creatinine Clearance: 45 mL/min (A) (by C-G formula based on SCr of 1.5 mg/dL (H)).   Medical History: Past Medical History:  Diagnosis Date  . Diabetes mellitus (HCC)    Assessment: 13 YOM admitted on 2/9 with possible sepsis, now with troponin elevation, pharmacy is consulted to start IV heparin.   Heparin level = 0.5, therapeutic after rate increased to 1500 units/hr   Goal of Therapy:  Heparin level 0.3-0.7 units/ml Monitor platelets by anticoagulation protocol: Yes   Plan:   Continue Heparin to 1500 units/hr F/u AM labs  Bayard Hugger, PharmD, BCPS, BCPPS Clinical Pharmacist  Pager: [phone]   Please check AMION for all [facility] phone numbers After 10:00 PM, call Main Pharmacy [phone] 02/18/2018 10:13 PM

## 2018-02-18 NOTE — Progress Notes (Signed)
  Date: 02/18/2018  Patient name: Philip [name].  Medical record number: [id number]  Date of birth: 05-Aug-1950   I have seen and evaluated this patient and I have discussed the plan of care with the house [facility]. Please see Dr. Sylvester Harder note for complete details. I concur with her findings and plan.     I think his troponemia is most consistent with a type 2 NSTEMI in the setting of septic shock, however, it is not clear if he also had an atheroembolic event (type 1).  Cardiology is following, recommended continuing heparin and considering LHC on Friday 2/14.  Will follow their recommendations.  Patient overall improving and mental status is at baseline.   Inez Catalina, MD 02/18/2018, 4:48 PM

## 2018-02-19 DIAGNOSIS — I214 Non-ST elevation (NSTEMI) myocardial infarction: Secondary | ICD-10-CM

## 2018-02-19 LAB — COMPREHENSIVE METABOLIC PANEL
ALK PHOS: 43 U/L (ref 38–126)
ALT: 30 U/L (ref 0–44)
ANION GAP: 11 (ref 5–15)
AST: 21 U/L (ref 15–41)
Albumin: 2.4 g/dL — ABNORMAL LOW (ref 3.5–5.0)
BUN: 17 mg/dL (ref 8–23)
CO2: 26 mmol/L (ref 22–32)
Calcium: 7.8 mg/dL — ABNORMAL LOW (ref 8.9–10.3)
Chloride: 101 mmol/L (ref 98–111)
Creatinine, Ser: 1.14 mg/dL (ref 0.61–1.24)
GFR calc Af Amer: >60 mL/min (ref >60)
GFR calc non Af Amer: >60 mL/min (ref >60)
Glucose, Bld: 202 mg/dL — ABNORMAL HIGH (ref 70–99)
Potassium: 3.7 mmol/L (ref 3.5–5.1)
Sodium: 138 mmol/L (ref 135–145)
Total Bilirubin: 0.9 mg/dL (ref 0.3–1.2)
Total Protein: 5.6 g/dL — ABNORMAL LOW (ref 6.5–8.1)

## 2018-02-19 LAB — CBC
HCT: 33.1 % — ABNORMAL LOW (ref 39.0–52.0)
Hemoglobin: 11 g/dL — ABNORMAL LOW (ref 13.0–17.0)
MCH: 30 pg (ref 26.0–34.0)
MCHC: 33.2 g/dL (ref 30.0–36.0)
MCV: 90.2 fL (ref 80.0–100.0)
Platelets: 161 10*3/uL (ref 150–400)
RBC: 3.67 MIL/uL — AB (ref 4.22–5.81)
RDW: 13.5 % (ref 11.5–15.5)
WBC: 5.9 10*3/uL (ref 4.0–10.5)
nRBC: 0 % (ref 0.0–0.2)

## 2018-02-19 LAB — GLUCOSE, CAPILLARY
Glucose-Capillary: 209 mg/dL — ABNORMAL HIGH (ref 70–99)
Glucose-Capillary: 229 mg/dL — ABNORMAL HIGH (ref 70–99)
Glucose-Capillary: 206 mg/dL — ABNORMAL HIGH (ref 70–99)
Glucose-Capillary: 151 mg/dL — ABNORMAL HIGH (ref 70–99)

## 2018-02-19 LAB — PROCALCITONIN: Procalcitonin: 0.76 ng/mL

## 2018-02-19 LAB — HEPARIN LEVEL (UNFRACTIONATED): Heparin Unfractionated: 0.43 IU/mL (ref 0.30–0.70)

## 2018-02-19 MED ORDER — INSULIN GLARGINE 100 UNIT/ML ~~LOC~~ SOLN
14.0000 [IU] | Freq: Every day | SUBCUTANEOUS | Status: DC
  Administered 2018-02-19 – 2018-02-22 (×4): 14 [IU] via SUBCUTANEOUS
  Filled 2018-02-19 (×4): qty 0.14

## 2018-02-19 MED ORDER — INSULIN ASPART 100 UNIT/ML ~~LOC~~ SOLN
6.0000 [IU] | Freq: Three times a day (TID) | SUBCUTANEOUS | Status: DC
  Administered 2018-02-19 – 2018-02-25 (×16): 6 [IU] via SUBCUTANEOUS

## 2018-02-19 MED ORDER — VANCOMYCIN HCL 10 G IV SOLR
1500.0000 mg | INTRAVENOUS | Status: DC
  Administered 2018-02-19 – 2018-02-20 (×2): 1500 mg via INTRAVENOUS
  Filled 2018-02-19 (×2): qty 1500

## 2018-02-19 MED ORDER — ATORVASTATIN CALCIUM 80 MG PO TABS
80.0000 mg | ORAL_TABLET | Freq: Every day | ORAL | Status: DC
  Administered 2018-02-19 – 2018-02-24 (×6): 80 mg via ORAL
  Filled 2018-02-19 (×6): qty 1

## 2018-02-19 NOTE — Progress Notes (Addendum)
Inpatient Diabetes Program Recommendations  AACE/ADA: New Consensus Statement on Inpatient Glycemic Control (2015)  Target Ranges:  Prepandial:   less than 140 mg/dL      Peak postprandial:   less than 180 mg/dL (1-2 hours)      Critically ill patients:  140 - 180 mg/dL   Lab Results  Component Value Date   GLUCAP 229 (H) 02/19/2018   HGBA1C 6.7 (H) 10/19/2016     Results for Balthaser, BLAINE MCCALLA (MRN [id number]) as of 02/19/2018 14:37  Ref. Range 02/18/2018 08:01 02/18/2018 12:13 02/18/2018 17:24 02/18/2018 20:37 02/19/2018 08:07 02/19/2018 11:55  Glucose-Capillary Latest Ref Range: 70 - 99 mg/dL 704 (H)  Novolog 8 units (5 correction and 3 mc)  247 (H)  Novolog 8 units (5 correction and 3 meal coverage) 162 (H)  Novolog 6 units (3 correction and 3 mc) 189 (H) 209 (H)  Novolog 8 units (5 units correction and 3 units mc) 229 (H)  Novolog 8 units (5 units correction and 3 units mc)   Diabetes history: DM2   Outpatient Diabetes medications: NPH 20 units BID; Metformin 1000mg  BID  Current orders for Inpatient glycemic control  :  Novolog moderate correction (0-15 units) tid and (0-5 units) hs                                                                                Novolog 3 units meal coverage tid wc                                                                                Lantus 10 units q hs     Patient has had a total of 23 units of Novolog correction since 0800 yesterday am. Recommend increase basal insulin.    MD please consider following inpatient insulin adjustments:   Increase Lantus to 15 units q hs.    Note Hgb A1c was drawn this am and is in process. (Last one was from 10/2016 and was 6.7%)  Thank you.    -- Will follow during hospitalization.--  Jamelle Rushing RN, MSN Diabetes Coordinator Inpatient Glycemic Control Team Team Pager: [phone] (8am-5pm)

## 2018-02-19 NOTE — Progress Notes (Signed)
Progress Note  Patient Name: Philip [name]. Date of Encounter: 02/19/2018  Primary Cardiologist: Dr. Newt Lukes, [facility]   Subjective   No complaints today. He feels fine. He denies CP. No dyspnea. No further abdominal pain. He was completely asymptomatic with his tachy arrhythmia yesterday.   Per review of notes yesterday, primary team was able to speak with pt's daughter. She informed that he had a stroke 3 years ago and has had subsequent expressive dysphasia.   Inpatient Medications    Scheduled Meds: . aspirin EC  81 mg Oral Daily  . Chlorhexidine Gluconate Cloth  6 each Topical Q0600  . citalopram  20 mg Oral Daily  . clopidogrel  75 mg Oral Daily  . insulin aspart  0-15 Units Subcutaneous TID WC  . insulin aspart  0-5 Units Subcutaneous QHS  . insulin aspart  3 Units Subcutaneous TID WC  . insulin glargine  10 Units Subcutaneous QHS  . mouth rinse  15 mL Mouth Rinse BID  . mupirocin ointment  1 application Nasal BID   Continuous Infusions: . ceFEPime (MAXIPIME) IV 2 g (02/19/18 0850)  . heparin 1,500 Units/hr (02/19/18 0454)  . vancomycin     PRN Meds: acetaminophen **OR** acetaminophen, LORazepam   Vital Signs    Vitals:   02/19/18 0052 02/19/18 0446 02/19/18 0452 02/19/18 0455  BP: (!) 102/54  105/61   Pulse: 84  93   Resp: (!) 21  (!) 22   Temp:  98.2 F (36.8 C)    TempSrc:  Oral    SpO2: 98%  92%   Weight:    95.1 kg  Height:        Intake/Output Summary (Last 24 hours) at 02/19/2018 1044 Last data filed at 02/19/2018 0454 Gross per 24 hour  Intake 690 ml  Output 1100 ml  Net -410 ml   Last 3 Weights 02/19/2018 02/18/2018 02/17/2018  Weight (lbs) 209 lb 10.5 oz 167 lb 12.3 oz 165 lb 12.6 oz  Weight (kg) 95.1 kg 76.1 kg 75.2 kg      Telemetry    NSR currently, had a run of Atrial flutter in the 140s yesterday, brief w/ spontaneous return to NSR w/o intervention and was asymptomatic- Personally Reviewed  ECG    Not performed today - Personally  Reviewed  Physical Exam   GEN: No acute distress.  Alert to person but not place or time.  Neck: No JVD Cardiac: RRR, no murmurs, rubs, or gallops.  Respiratory: Clear to auscultation bilaterally. GI: Soft, nontender, non-distended  MS: No edema; No deformity. Neuro:  Nonfocal  Psych: Normal affect   Labs    Chemistry Recent Labs  Lab 02/15/18 1727 02/16/18 0340  02/17/18 2340 02/18/18 0914 02/19/18 0411  NA 140 138   < > 138 136 138  K 4.8 4.2   < > 3.6 4.1 3.7  CL 107 105   < > 98 96* 101  CO2 23 23   < > 26 21* 26  GLUCOSE 146* 201*   < > 219* 281* 202*  BUN 25* 24*   < > 18 19 17   CREATININE 1.70* 1.83*   < > 1.58* 1.50* 1.14  CALCIUM 7.8* 7.7*   < > 7.9* 8.0* 7.8*  PROT 5.7* 5.9*  --   --   --  5.6*  ALBUMIN 2.8* 2.9*  --   --   --  2.4*  AST 52* 38  --   --   --  21  ALT 76* 62*  --   --   --  30  ALKPHOS 58 59  --   --   --  43  BILITOT 0.9 1.3*  --   --   --  0.9  GFRNONAA 40* 37*   < > 44* 47* >60  GFRAA 47* 43*   < > 51* 54* >60  ANIONGAP 10 10   < > 14 19* 11   < > = values in this interval not displayed.     Hematology Recent Labs  Lab 02/17/18 0013 02/17/18 2340 02/19/18 0411  WBC 14.2* 9.3 5.9  RBC 3.90* 3.86* 3.67*  HGB 11.9* 11.4* 11.0*  HCT 35.5* 35.1* 33.1*  MCV 91.0 90.9 90.2  MCH 30.5 29.5 30.0  MCHC 33.5 32.5 33.2  RDW 14.1 13.8 13.5  PLT 187 182 161    Cardiac Enzymes Recent Labs  Lab 02/16/18 1529 02/16/18 1915 02/17/18 0013 02/18/18 0914  TROPONINI 2.38* 5.24* 6.95* 2.50*   No results for input(s): TROPIPOC in the last 168 hours.   BNP Recent Labs  Lab 02/15/18 0230  BNP 124.5*     DDimer No results for input(s): DDIMER in the last 168 hours.   Radiology    No results found.  Cardiac Studies   2D Echo 02/15/18 IMPRESSIONS   1. The left ventricle has low normal systolic function of 50-55%.There is akinesis of the apical inferolateral, inferior, lateral, anterior and septal left ventricular segments. 2.  The right ventricle has normal systolic function. The cavity was normal. There is no increase in right ventricular wall thickness. 3. The mitral valve is normal in structure. There is mild mitral annular calcification present. 4. The tricuspid valve is normal in structure. 5. The aortic valve has an indeterminant number of cusps There is moderate thickening and mild calcification of the aortic valve, with moderately decreased cusp excursion. 6. The pulmonic valve was normal in structure.  Patient Profile     68 y.o.malew/ h/o CAD, [facility], s/p CABG in 2015, post operative atrial fibrillation, HTN, HLD, Diabetes, CKD and prior CVA w/ subsequent expressive dysphasia, admitted for septic shock of unknown source. Cardiology was consulted for elevated troponin. Also noted to have intermittedNSVT on tele as well as brief run of atrial flutter w/ RVR 02/18/18.   Assessment & Plan    1. Septic Shock of Unknown Source: w/u including blood cultures negative. Sepsis improved w/ antibiotics. WBC down and afebrile. Further management per IM.   2. Elevated Troponin: significant elevation but now starting to trend downward, 2.38>>5.24>>6.95>>2.09. initial EKG non acute and he has denied CP this entire admit.  Echo done on 2/9 showed preserved LVEF but akinesis of the apical inferolateral, inferior, lateral, anterior and septal left ventricular segments. Unsure if WMAs are new or old. [facility] with normal EF but no mention of wall motion in report. He has however had arrhthymias captured on tele, including NSVT and atrial flutter. Recommend continuation of IV heparin and medical management of known CAD. He would benefit from cath given known CAD history and troponin level, but would need family to consent given his baseline cognitive function.    3. CAD: underwent coronary angiography in 01/2012, which revealed severe 3-vessel coronary disease. Preserved left ventricular function  at that time. He subsequently did undergo coronary artery bypass graft surgery in August 2015 with placement of a left internal mammary graft to the LAD, saphenous vein graft to  the obtuse marginal, saphenous vein graft to the posterior descending artery.Has since been followed by cardiology at [facility]. Denies any recent CP but trops elevated as outlined above concerning for ACS. ? Silent ischemia given DM. He would benefit from cath prior to d/c if family can consent. His hepatic enzymes have returned to normal. Will start statin. LDL is 161 mg/dL. Will start Lipitor 80. His BP may be too soft for oral  blocker.  4. Atrial Flutter w/ RVR: first occurrence 02/18/18 ~1:30 PM. V-rates in the 140s but asymptomatic. Spontaneous conversion back to NSR w/o intervention. Currently NSR. Continue on IV heparin. Further ischemic w/u pending. May need outpatient monitor post discharge for further surveillance. He does have a h/o CVA and would benefit from oral anticoagulation for secondary prevention. I would worry however about compliance given his cognitive baseline/ ? Dispo. Home with family vs nursing facility.   4. NSVT: noted early on during admission but no recurrence on tele. Continue to monitor.   5. AKI: resolved. SCr back WNL at 1.14.   6. Cognitive: Per review of notes yesterday, primary team was able to speak with pt's daughter. She informed that he had a stroke 3 years ago and has had subsequent expressive dysphasia.  7. HLD:  His hepatic enzymes have returned to normal. Will start statin for CAD/ NSTEMI. LDL is 161 mg/dL. Will start Lipitor 80. LDL goal is < 70 mg/dL. He will need to f/u with his primary cardiologist in 6-8 weeks for repeat FLP and HFTs.     For questions or updates, please contact [facility] Please consult [email or web address] for contact info under        Signed, Robbie Lis, PA-C  02/19/2018, 10:44 AM

## 2018-02-19 NOTE — Progress Notes (Signed)
Subjective: No overnight events. The patient's ex-wife is at the bedside today. He reports that he is feeling well, and his ex-wife reports that he seems to be close to his baseline but has a little more confusion then normal. He denies any chest pain, abdominal pain, or back pain today. All of their questions were addressed.   Objective:  Vital signs in last 24 hours: Vitals:   02/19/18 0052 02/19/18 0446 02/19/18 0452 02/19/18 0455  BP: (!) 102/54  105/61   Pulse: 84  93   Resp: (!) 21  (!) 22   Temp:  98.2 F (36.8 C)    TempSrc:  Oral    SpO2: 98%  92%   Weight:    95.1 kg  Height:        General: lying comfortably in bed, no distress Cardiac: RRR, no m/r/g Pulmonary: CTABL, no wheezing, rhonchi or rales Abdomen: Soft, non-tender, +BS Extremity:Warm, no LE edema Psychiatry: Intermittent confusion, expressive aphasia    Assessment/Plan:  Active Problems:   Elevated transaminase level   Shock (HCC)   Severe sepsis (HCC)   Acute respiratory failure with hypoxia (HCC)   Sepsis (HCC)   Hypotension   Hypoxemia   Sinus tachycardia  This is a 68 year old male with a history of CAD s/p CABG, MCA CVA, DMII, HTN, andCKD who presented with a 1 day history of n/v, diarrhea, confusion and lethargy. Found to be hypotensive with a SBP of 70 and hypoglycemic with a CBG of 69. He was found to be febrile to 101, and hypotensive. He was fluid resuscitated however SBPs still remained low. Started on zosyn and azithromycin.  Severe hypotension 2/2emesis: Shock, likely septic 2/2 unknown source: -Blood pressures have improved significantly, he has remained afebrile with no leukocytosis.  He appears to be back to his baseline in his mental status, has a history of expressive aphasia and ex-wife reports that this is close to where he normally is.  If still not found a source, he may have had a component of her pneumonia and will continue vancomycin and cefepime x7 days.  We turned off  his oxygen while in the room and is continued to oxygenate.  94 to 95%.  Overall his septic shock seems to be improving.  -Continue vancomycin and cefepime (2/11-2/17), may be able to switch over to PO medications later -Tylenol PRN for fevers -Imodium for diarrhea -PT/OT -Duonebs PRN  ?Anxiety: Patient has been having these episodes of diaphoresis, hypoxia,andtachycardia. He will have complaints of shortness of breath and some anxiety and agitation. This may be all related to his pulmonary edema and episodes of SVT however possible that a anxiety attack may be contributing. We will do a trial of Ativan as needed to see if this may help.   He has not had any episodes of anxiety.  -Discontinue Ativan 1 mg as needed for anxiety -Continue to monitor  AKI: -Improving today, Cr at 1.14 -Continue to follow BMP  Hypertension:  -Patient is on metoprolol and amlodipine at home. He was admitted for septic shock and he was hypotensive. His blood pressures are still on the lower side so we will continue to hold these medications for now.  -Continue to monitor  Atrial flutter w/ RVR: Elevated troponin: H/o CAD s/p CABG: -He had an episode of atrial flutter on 12/12 with ventricular rates in the 140s, he continued to be asymptomatic.  Troponin peaked at 6.95.  Echocardiogram on 2/9 showed preserved EF with akinesis of  the apical lateral.,  Lateral, anterior and septal left ventricular segments.  He has been having intermittent episodes of SVT and atrial flutter.  Cardiology following and recommends continuing IV heparin medical management of CAD.  They are considering a cardiac cath however would need family consent for this.  He is on plavix and ASA at home, patient reported that he had been taking this. -Continue Plavix and ASA -Cardiology following, appreciate recommendations -Once stable from infection they would consider cath -Conintue IV heparin  Hyperlipidemia: -Start Lipitor 80 mg  daily  Diabetes: -On insulin and metformin at home.  -ContinueSSI -Frequent CBGs  FEN:No fluids, replete lytes prn, heart healthy VTE ppx: Lovenox  Code Status: FULL  Dispo: Anticipated discharge is pending clinical improvement  Claudean SeveranceKrienke, Marissa M, MD 02/19/2018, 6:40 AM Pager: [phone]

## 2018-02-19 NOTE — Progress Notes (Addendum)
4:45pm-Per RN, patient's daughter, Aram BeechamCynthia, is involved and provided the same number as Darel HongJudy [phone]). CSW awaiting call back.   2:36pm-CSW left voicemail for patient's contact listed on the facesheet, Darel HongJudy (significant other). Other listed number does not work. CSW trying to determine next of kin to help with medical consents and discharge planning.   Osborne Cascoadia Heliodoro Domagalski LCSW [phone]

## 2018-02-19 NOTE — Progress Notes (Signed)
  Date: 02/19/2018  Patient name: Philip [name].  Medical record number: [id number]  Date of birth: [birth year]   I have seen and evaluated this patient and I have discussed the plan of care with the house [facility]. Please see Dr. Sylvester Harder note for complete details. I concur with her findings and plans.    Inez Catalina, MD 02/19/2018, 9:34 PM

## 2018-02-19 NOTE — Evaluation (Signed)
Occupational Therapy Evaluation Patient Details Name: Philip RidgeHelmut R [name] Philip HagemanJr. MRN: [id number] DOB: [birth year] Today's Date: 02/19/2018    History of Present Illness Patient is a 68 year old male admitted with nausea, vomiting, diarreah, increased lethargy and confusion. Found to have leukocytosis, septic shock.  PMH to include: HTN, CKD, DM, CAD s/p CABG, CVA with L side weakness.       Clinical Impression   Pt with decline in function and safety with ADLs and ADL mobility with decreased strength, balance, endurance and cognition. Pt is a Poor historian, uncertain is PLOF reported is accurate. Pt with difficulty following/comprehending commands, slow processing and requires multimodal  cues for task initiation, sequencing and completion. Pt with FMC/GMC impaired and Poor safety awareness. Pt requires min- mod A with ADLs/selfcare. Uncertain of baseline level of cognitive function at this time. Pt would benefit from acute OT services to address impairments to maximize level of function and safety    Follow Up Recommendations  SNF;Supervision/Assistance - 24 hour    Equipment Recommendations  Other (comment)(TBD at next venue of care)    Recommendations for Other Services       Precautions / Restrictions Precautions Precautions: Fall Restrictions Weight Bearing Restrictions: No      Mobility Bed Mobility Overal bed mobility: Modified Independent             General bed mobility comments: use of bed rails  Transfers Overall transfer level: Modified independent Equipment used: Rolling walker (2 wheeled) Transfers: Sit to/from Stand Sit to Stand: Min guard         General transfer comment: Cues needed for safety with transfers, hand placement    Balance Overall balance assessment: Mild deficits observed, not formally tested                                         ADL either performed or assessed with clinical judgement   ADL Overall ADL's : Needs  assistance/impaired Eating/Feeding: Set up;Sitting   Grooming: Wash/dry hands;Wash/dry face;Standing;Minimal assistance Grooming Details (indicate cue type and reason): FMC/GMC impaired Upper Body Bathing: Minimal assistance;Sitting   Lower Body Bathing: Moderate assistance   Upper Body Dressing : Minimal assistance;Sitting   Lower Body Dressing: Moderate assistance   Toilet Transfer: Min guard;Cueing for safety;Cueing for sequencing;Ambulation;Regular Toilet;Grab bars   Toileting- Clothing Manipulation and Hygiene: Minimal assistance;Sit to/from stand;Cueing for sequencing;Cueing for safety   Tub/ Shower Transfer: Min guard;Ambulation;Rolling walker;Grab bars   Functional mobility during ADLs: Min guard;Cueing for safety;Cueing for sequencing;Rolling walker General ADL Comments: pt with cognitive impairments and requires multiodal cues, instructions to be repeated and cues for safety     Vision Patient Visual Report: No change from baseline(pt reports no visual impairments or changes)       Perception     Praxis      Pertinent Vitals/Pain Pain Assessment: No/denies pain     Hand Dominance Left   Extremity/Trunk Assessment Upper Extremity Assessment Upper Extremity Assessment: Generalized weakness;RUE deficits/detail RUE Coordination: decreased gross motor;decreased fine motor   Lower Extremity Assessment Lower Extremity Assessment: Defer to PT evaluation   Cervical / Trunk Assessment Cervical / Trunk Assessment: Normal   Communication Communication Communication: No difficulties   Cognition Arousal/Alertness: Awake/alert   Overall Cognitive Status: No family/caregiver present to determine baseline cognitive functioning Area of Impairment: Orientation;Memory;Following commands;Problem solving;Awareness;Safety/judgement  Orientation Level: Disoriented to;Place;Time;Situation   Memory: Decreased short-term memory Following Commands:  Follows one step commands inconsistently     Problem Solving: Slow processing;Difficulty sequencing;Requires verbal cues;Requires tactile cues General Comments: patient unable to follow directions consistently or answer questions consistently. No family present   General Comments       Exercises Other Exercises Other Exercises: standing marching x 10 reps, attempted repeated STS but patient unable to follow direction to perform.     Shoulder Instructions      Home Living Family/patient expects to be discharged to:: Skilled nursing facility Living Arrangements: Other (Comment)                               Additional Comments: unsure, patient poor historian, has difficulty answering questions. Reports he lives with his xwife and son.      Prior Functioning/Environment Level of Independence: Independent                 OT Problem List: Decreased activity tolerance;Impaired vision/perception;Decreased cognition;Decreased knowledge of use of DME or AE;Decreased knowledge of precautions;Decreased safety awareness;Decreased coordination;Impaired balance (sitting and/or standing)      OT Treatment/Interventions: Self-care/ADL training;Cognitive remediation/compensation;Balance training;Therapeutic exercise;DME and/or AE instruction;Neuromuscular education;Therapeutic activities;Patient/family education    OT Goals(Current goals can be found in the care plan section) Acute Rehab OT Goals Patient Stated Goal: none stated OT Goal Formulation: With patient Time For Goal Achievement: 03/05/18 Potential to Achieve Goals: Good ADL Goals Pt Will Perform Grooming: with min guard assist;standing Pt Will Perform Upper Body Bathing: with min guard assist;with supervision;with set-up;sitting Pt Will Perform Lower Body Bathing: with min assist;with min guard assist;sit to/from stand Pt Will Perform Upper Body Dressing: with min guard assist;with supervision;with  set-up;sitting Pt Will Perform Lower Body Dressing: with min assist;with min guard assist;sit to/from stand Pt Will Transfer to Toilet: with supervision;ambulating;regular height toilet;grab bars Pt Will Perform Toileting - Clothing Manipulation and hygiene: with min guard assist;with supervision;sit to/from stand  OT Frequency: Min 2X/week   Barriers to D/C: Decreased caregiver support          Co-evaluation              AM-PAC OT "6 Clicks" Daily Activity     Outcome Measure Help from another person eating meals?: None Help from another person taking care of personal grooming?: A Little Help from another person toileting, which includes using toliet, bedpan, or urinal?: A Lot Help from another person bathing (including washing, rinsing, drying)?: A Lot Help from another person to put on and taking off regular upper body clothing?: A Little Help from another person to put on and taking off regular lower body clothing?: A Lot 6 Click Score: 16   End of Session Equipment Utilized During Treatment: Gait belt  Activity Tolerance: Patient tolerated treatment well Patient left: in bed;with call bell/phone within reach;with bed alarm set  OT Visit Diagnosis: Unsteadiness on feet (R26.81);Other abnormalities of gait and mobility (R26.89);Muscle weakness (generalized) (M62.81);Other symptoms and signs involving cognitive function;Cognitive communication deficit (R41.841)                Time: [id number] OT Time Calculation (min): 26 min Charges:  OT General Charges $OT Visit: 1 Visit OT Evaluation $OT Eval Moderate Complexity: 1 Mod OT Treatments $Self Care/Home Management : 8-22 mins   Galen ManilaSpencer, Perlita Forbush Jeanette 02/19/2018, 1:43 PM

## 2018-02-19 NOTE — Progress Notes (Signed)
ANTICOAGULATION CONSULT NOTE - Follow-Up Consult  Pharmacy Consult for Heparin Indication: chest pain/ACS  Allergies  Allergen Reactions  . Codeine     GI upset    Patient Measurements: Height: 5\' 8"  (172.7 cm)(will verify) Weight: 209 lb 10.5 oz (95.1 kg) IBW/kg (Calculated) : 68.4 Heparin Dosing Weight: 75.2kg  Vital Signs: Temp: 98.2 F (36.8 C) (02/13 0446) Temp Source: Oral (02/13 0446) BP: 105/61 (02/13 0452) Pulse Rate: 93 (02/13 0452)  Labs: Recent Labs    02/16/18 1915  02/17/18 0013  02/17/18 2340 02/18/18 0914 02/18/18 2125 02/19/18 0411  HGB  --    < > 11.9*  --  11.4*  --   --  11.0*  HCT  --   --  35.5*  --  35.1*  --   --  33.1*  PLT  --   --  187  --  182  --   --  161  HEPARINUNFRC  --   --  0.58   < > 0.24* 0.27* 0.50 0.43  CREATININE  --    < > 1.94*  --  1.58* 1.50*  --  1.14  TROPONINI 5.24*  --  6.95*  --   --  2.50*  --   --    < > = values in this interval not displayed.    Estimated Creatinine Clearance: 68.4 mL/min (by C-G formula based on SCr of 1.14 mg/dL).   Medical History: Past Medical History:  Diagnosis Date  . Diabetes mellitus (HCC)    Assessment: 73 YOM admitted on 2/9 with possible sepsis, now with troponin elevation, pharmacy is consulted to start IV heparin.   Heparin level remains therapeutic at 0.43 s/p rate change to 1500 units/hr, H/H stable, plts 161, no bleeding reported.  Goal of Therapy:  Heparin level 0.3-0.7 units/ml Monitor platelets by anticoagulation protocol: Yes   Plan:  Continue heparin gtt at 1500 units/hr Daily heparin level, CBC, s/s bleeding  Daylene Posey, PharmD Clinical Pharmacist Please check AMION for all [facility] numbers 02/19/2018 8:29 AM

## 2018-02-19 NOTE — Progress Notes (Signed)
Pharmacy Antibiotic Note  Philip [name]. is a 68 y.o. male admitted on 02/15/2018 with acute encephalopathy and concern for sepsis of possible PNA source. The patient has not improved on the current antibiotic regimen of Zosyn & Azithromycin. Pharmacy has been consulted to change Zosyn to Cefepime and add Vancomycin, continuing Azithro per MD.  AKI improving, AF, WBC wnl  Plan: Change vancomycin to 1500mg  IV every 24 hours (calc AUC 516, SCr 1.15) Continue Cefepime 2g IV every 12 hours Will continue to follow renal function, culture results, LOT, and antibiotic de-escalation plans   Height: 5\' 8"  (172.7 cm)(will verify) Weight: 209 lb 10.5 oz (95.1 kg) IBW/kg (Calculated) : 68.4  Temp (24hrs), Avg:97.9 F (36.6 C), Min:97.4 F (36.3 C), Max:98.2 F (36.8 C)  Recent Labs  Lab 02/15/18 0230 02/15/18 0850 02/15/18 1202  02/16/18 0340 02/16/18 0947 02/16/18 1529 02/16/18 1915 02/17/18 0013 02/17/18 2340 02/18/18 0914 02/19/18 0411  WBC 10.9*  --   --   --  11.4*  --   --   --  14.2* 9.3  --  5.9  CREATININE 1.70*  --   --    < > 1.83*  --   --   --  1.94* 1.58* 1.50* 1.14  LATICACIDVEN  --  3.2* 1.6  --   --  3.3* 2.1* 1.6  --   --   --   --    < > = values in this interval not displayed.    Estimated Creatinine Clearance: 68.4 mL/min (by C-G formula based on SCr of 1.14 mg/dL).    Allergies  Allergen Reactions  . Codeine     GI upset    Antimicrobials this admission: CTX 2/9 x 1 Zosyn 2/9 >> 2/11 Azithro 2/9 >> Vanc 2/11 >> Cefepime 2/11 >>  Dose adjustments this admission: n/a  Microbiology results: 2/9 Resp panel >> neg 2/9 BCx >> ngtd 2/10 MRSA PCR >> positive  Daylene Posey, PharmD Clinical Pharmacist Please check AMION for all [facility] numbers 02/19/2018 8:50 AM

## 2018-02-19 NOTE — Evaluation (Signed)
Physical Therapy Evaluation Patient Details Name: Philip [name]. MRN: [id number] DOB: 12/19/1950 Today's Date: 02/19/2018   History of Present Illness  Patient is a 68 year old male admitted with nausea, vomiting, diarreah, increased lethargy and confusion. Found to have leukocytosis, septic shock.  PMH to include: HTN, CKD, DM, CAD s/p CABG, CVA with L side weakness.      Clinical Impression  Patient alert, awake, received in bed. Confused, difficulty answering questions consistently. Agreeable to PT evaluation. Patient performed bed mobility with modified independence using rails. Performed sit to stand transfer with min guard assist and cues for safety. Able to ambulate 20 feet with RW and min guard. Slightly unsteady. With attempts at LE exercises, patient having difficulty following directions.  Patient will benefit from skilled PT to address his weakness, and difficulty with mobility to achieve maximal independence and safety. Unsure of prior home/cognitive status therefore referring patient to SNF.       Follow Up Recommendations SNF    Equipment Recommendations  Rolling walker with 5" wheels    Recommendations for Other Services       Precautions / Restrictions Precautions Precautions: Fall Restrictions Weight Bearing Restrictions: No      Mobility  Bed Mobility Overal bed mobility: Modified Independent             General bed mobility comments: use of bed rails  Transfers Overall transfer level: Modified independent Equipment used: Rolling walker (2 wheeled) Transfers: Sit to/from Stand Sit to Stand: Min guard         General transfer comment: Cues needed for safety with transfers, hand placement  Ambulation/Gait Ambulation/Gait assistance: Min guard Gait Distance (Feet): 20 Feet Assistive device: Rolling walker (2 wheeled) Gait Pattern/deviations: Step-through pattern     General Gait Details: patient slightly unsteady with ambulation.   Stairs             Wheelchair Mobility    Modified Rankin (Stroke Patients Only)       Balance Overall balance assessment: Mild deficits observed, not formally tested                                           Pertinent Vitals/Pain Pain Assessment: No/denies pain    Home Living                   Additional Comments: unsure, patient poor historian, has difficulty answering questions. Reports he lives with his xwife and son.    Prior Function Level of Independence: Independent               Hand Dominance        Extremity/Trunk Assessment   Upper Extremity Assessment Upper Extremity Assessment: Defer to OT evaluation    Lower Extremity Assessment Lower Extremity Assessment: Generalized weakness       Communication   Communication: No difficulties  Cognition Arousal/Alertness: Awake/alert   Overall Cognitive Status: No family/caregiver present to determine baseline cognitive functioning                                 General Comments: patient unable to follow directions consistently or answer questions consistently. No family present      General Comments      Exercises Other Exercises Other Exercises: standing marching x 10 reps, attempted repeated STS  but patient unable to follow direction to perform.     Assessment/Plan    PT Assessment Patient needs continued PT services  PT Problem List Decreased strength;Decreased balance;Decreased cognition;Decreased knowledge of precautions;Decreased mobility;Decreased knowledge of use of DME;Decreased activity tolerance;Decreased coordination;Decreased safety awareness       PT Treatment Interventions Gait training;Functional mobility training;Balance training;Patient/family education;Therapeutic activities;Neuromuscular re-education;DME instruction;Therapeutic exercise;Stair training    PT Goals (Current goals can be found in the Care Plan section)  Acute Rehab PT  Goals Patient Stated Goal: no goals stated PT Goal Formulation: Patient unable to participate in goal setting Time For Goal Achievement: 03/05/18    Frequency Min 2X/week   Barriers to discharge        Co-evaluation               AM-PAC PT "6 Clicks" Mobility  Outcome Measure Help needed turning from your back to your side while in a flat bed without using bedrails?: A Little Help needed moving from lying on your back to sitting on the side of a flat bed without using bedrails?: A Little Help needed moving to and from a bed to a chair (including a wheelchair)?: A Little Help needed standing up from a chair using your arms (e.g., wheelchair or bedside chair)?: A Little Help needed to walk in hospital room?: A Little Help needed climbing 3-5 steps with a railing? : A Lot 6 Click Score: 17    End of Session Equipment Utilized During Treatment: Gait belt Activity Tolerance: Patient tolerated treatment well Patient left: in bed;with bed alarm set;with call bell/phone within reach Nurse Communication: Mobility status PT Visit Diagnosis: Muscle weakness (generalized) (M62.81);Unsteadiness on feet (R26.81);Difficulty in walking, not elsewhere classified (R26.2)    Time: [id number] PT Time Calculation (min) (ACUTE ONLY): 22 min   Charges:   PT Evaluation $PT Eval Moderate Complexity: 1 Mod PT Treatments $Gait Training: 8-22 mins        Josie Burleigh, PT, GCS 02/19/18,11:37 AM

## 2018-02-20 LAB — CULTURE, BLOOD (ROUTINE X 2)
Culture: NO GROWTH
Culture: NO GROWTH

## 2018-02-20 LAB — BASIC METABOLIC PANEL
Anion gap: 10 (ref 5–15)
BUN: 15 mg/dL (ref 8–23)
CHLORIDE: 100 mmol/L (ref 98–111)
CO2: 28 mmol/L (ref 22–32)
Calcium: 8.2 mg/dL — ABNORMAL LOW (ref 8.9–10.3)
Creatinine, Ser: 1.05 mg/dL (ref 0.61–1.24)
GFR calc Af Amer: >60 mL/min (ref >60)
GFR calc non Af Amer: >60 mL/min (ref >60)
Glucose, Bld: 194 mg/dL — ABNORMAL HIGH (ref 70–99)
Potassium: 3.7 mmol/L (ref 3.5–5.1)
Sodium: 138 mmol/L (ref 135–145)

## 2018-02-20 LAB — CBC
HCT: 35.5 % — ABNORMAL LOW (ref 39.0–52.0)
Hemoglobin: 11.7 g/dL — ABNORMAL LOW (ref 13.0–17.0)
MCH: 30 pg (ref 26.0–34.0)
MCHC: 33 g/dL (ref 30.0–36.0)
MCV: 91 fL (ref 80.0–100.0)
Platelets: 206 10*3/uL (ref 150–400)
RBC: 3.9 MIL/uL — ABNORMAL LOW (ref 4.22–5.81)
RDW: 13.9 % (ref 11.5–15.5)
WBC: 7 10*3/uL (ref 4.0–10.5)
nRBC: 0 % (ref 0.0–0.2)

## 2018-02-20 LAB — GLUCOSE, CAPILLARY
GLUCOSE-CAPILLARY: 153 mg/dL — AB (ref 70–99)
Glucose-Capillary: 183 mg/dL — ABNORMAL HIGH (ref 70–99)
Glucose-Capillary: 163 mg/dL — ABNORMAL HIGH (ref 70–99)
Glucose-Capillary: 174 mg/dL — ABNORMAL HIGH (ref 70–99)

## 2018-02-20 LAB — HEPARIN LEVEL (UNFRACTIONATED): Heparin Unfractionated: 0.65 IU/mL (ref 0.30–0.70)

## 2018-02-20 LAB — HEMOGLOBIN A1C
Hgb A1c MFr Bld: 7.7 % — ABNORMAL HIGH (ref 4.8–5.6)
Mean Plasma Glucose: 174 mg/dL

## 2018-02-20 MED ORDER — CEFDINIR 300 MG PO CAPS
600.0000 mg | ORAL_CAPSULE | Freq: Every day | ORAL | Status: AC
  Administered 2018-02-20 – 2018-02-23 (×4): 600 mg via ORAL
  Filled 2018-02-20 (×7): qty 2

## 2018-02-20 MED ORDER — DOXYCYCLINE HYCLATE 100 MG PO TABS
100.0000 mg | ORAL_TABLET | Freq: Two times a day (BID) | ORAL | Status: AC
  Administered 2018-02-20 – 2018-02-23 (×8): 100 mg via ORAL
  Filled 2018-02-20 (×8): qty 1

## 2018-02-20 NOTE — Progress Notes (Signed)
Pharmacy Antibiotic Note  Philip [name]. is a 68 y.o. male admitted on 02/15/2018 with acute encephalopathy and concern for sepsis of possible PNA source. He has been on vanc/cefepime since 2/11. His MRSA PCR was positive. D/w with team today to change his abx to cefdinir/doxy to complete 7d of abx (2/17)  Plan: Dc vanc/cefepime Cefdinir 600mg  PO qday x4d Doxy 100mg  BID x4d Rx sign off  Height: 5\' 8"  (172.7 cm)(will verify) Weight: 172 lb 13.5 oz (78.4 kg) IBW/kg (Calculated) : 68.4  Temp (24hrs), Avg:98.3 F (36.8 C), Min:98 F (36.7 C), Max:98.5 F (36.9 C)  Recent Labs  Lab 02/15/18 0850 02/15/18 1202  02/16/18 0340 02/16/18 0947 02/16/18 1529 02/16/18 1915 02/17/18 0013 02/17/18 2340 02/18/18 0914 02/19/18 0411 02/20/18 0508  WBC  --   --   --  11.4*  --   --   --  14.2* 9.3  --  5.9 7.0  CREATININE  --   --    < > 1.83*  --   --   --  1.94* 1.58* 1.50* 1.14 1.05  LATICACIDVEN 3.2* 1.6  --   --  3.3* 2.1* 1.6  --   --   --   --   --    < > = values in this interval not displayed.    Estimated Creatinine Clearance: 64.2 mL/min (by C-G formula based on SCr of 1.05 mg/dL).    Allergies  Allergen Reactions  . Codeine     GI upset    Antimicrobials this admission: CTX 2/9 x 1 Zosyn 2/9 >> 2/11 Azithro 2/9 >> Vanc 2/11 >>2/14 Cefepime 2/11 >>2/14 Cefdinir 2/14>>2/17 Doxycycline 2/14>>2/17  Dose adjustments this admission: n/a  Microbiology results: 2/9 Resp panel >> neg 2/9 BCx >> ngtd 2/10 MRSA PCR >> positive  Ulyses Southward, PharmD, [place], AAHIVP, CPP Infectious Disease Pharmacist 02/20/2018 1:08 PM

## 2018-02-20 NOTE — Progress Notes (Signed)
   I attempted to call patient's ex-wife, Darel Hong, to discuss catheterization and obtain consent but again there was no answer.  Procedure tentatively scheduled for Monday.  Berton Bon, AGNP-C [facility] 02/20/2018  5:15 PM Pager: [phone]

## 2018-02-20 NOTE — Progress Notes (Signed)
Progress Note  Patient Name: Philip [name]. Date of Encounter: 02/20/2018  Primary Cardiologist: Dr. Newt Lukes, [facility]  Subjective   Patient denies any chest discomfort or shortness of breath.  It is unclear how much he comprehends.  In explaining cardiac catheterization he verbalizes understanding but then asks " now what are we supposed to be doing" repeatedly.  Inpatient Medications    Scheduled Meds: . aspirin EC  81 mg Oral Daily  . atorvastatin  80 mg Oral q1800  . citalopram  20 mg Oral Daily  . clopidogrel  75 mg Oral Daily  . insulin aspart  0-15 Units Subcutaneous TID WC  . insulin aspart  0-5 Units Subcutaneous QHS  . insulin aspart  6 Units Subcutaneous TID WC  . insulin glargine  14 Units Subcutaneous QHS  . mouth rinse  15 mL Mouth Rinse BID  . mupirocin ointment  1 application Nasal BID   Continuous Infusions: . ceFEPime (MAXIPIME) IV 2 g (02/20/18 0815)  . heparin 1,500 Units/hr (02/20/18 0413)  . vancomycin 1,500 mg (02/20/18 1206)   PRN Meds: acetaminophen **OR** acetaminophen, LORazepam   Vital Signs    Vitals:   02/20/18 0534 02/20/18 0731 02/20/18 1212 02/20/18 1237  BP:   (!) 123/57   Pulse:      Resp:      Temp: 98.3 F (36.8 C) 98.4 F (36.9 C) 98.2 F (36.8 C)   TempSrc: Oral Oral    SpO2:    (!) 77%  Weight:      Height:        Intake/Output Summary (Last 24 hours) at 02/20/2018 1303 Last data filed at 02/20/2018 0413 Gross per 24 hour  Intake 1819.45 ml  Output 325 ml  Net 1494.45 ml   Last 3 Weights 02/20/2018 02/19/2018 02/18/2018  Weight (lbs) 172 lb 13.5 oz 209 lb 10.5 oz 167 lb 12.3 oz  Weight (kg) 78.4 kg 95.1 kg 76.1 kg      Telemetry    Sinus rhythm in the 80s with occasional PVCs- Personally Reviewed  ECG    No new tracings- Personally Reviewed  Physical Exam   GEN: No acute distress.   Neck: No JVD Cardiac: RRR, no murmurs, rubs, or gallops.  Respiratory: Clear to auscultation bilaterally. GI: Soft,  nontender, non-distended  MS: No edema; No deformity. Neuro:   Mildly cognitively impaired Psych: Normal affect   Labs    Chemistry Recent Labs  Lab 02/15/18 1727 02/16/18 0340  02/18/18 0914 02/19/18 0411 02/20/18 0508  NA 140 138   < > 136 138 138  K 4.8 4.2   < > 4.1 3.7 3.7  CL 107 105   < > 96* 101 100  CO2 23 23   < > 21* 26 28  GLUCOSE 146* 201*   < > 281* 202* 194*  BUN 25* 24*   < > 19 17 15   CREATININE 1.70* 1.83*   < > 1.50* 1.14 1.05  CALCIUM 7.8* 7.7*   < > 8.0* 7.8* 8.2*  PROT 5.7* 5.9*  --   --  5.6*  --   ALBUMIN 2.8* 2.9*  --   --  2.4*  --   AST 52* 38  --   --  21  --   ALT 76* 62*  --   --  30  --   ALKPHOS 58 59  --   --  43  --   BILITOT 0.9 1.3*  --   --  0.9  --   GFRNONAA 40* 37*   < > 47* >60 >60  GFRAA 47* 43*   < > 54* >60 >60  ANIONGAP 10 10   < > 19* 11 10   < > = values in this interval not displayed.     Hematology Recent Labs  Lab 02/17/18 2340 02/19/18 0411 02/20/18 0508  WBC 9.3 5.9 7.0  RBC 3.86* 3.67* 3.90*  HGB 11.4* 11.0* 11.7*  HCT 35.1* 33.1* 35.5*  MCV 90.9 90.2 91.0  MCH 29.5 30.0 30.0  MCHC 32.5 33.2 33.0  RDW 13.8 13.5 13.9  PLT 182 161 206    Cardiac Enzymes Recent Labs  Lab 02/16/18 1529 02/16/18 1915 02/17/18 0013 02/18/18 0914  TROPONINI 2.38* 5.24* 6.95* 2.50*   No results for input(s): TROPIPOC in the last 168 hours.   BNP Recent Labs  Lab 02/15/18 0230  BNP 124.5*     DDimer No results for input(s): DDIMER in the last 168 hours.   Radiology    No results found.  Cardiac Studies   2D Echo 02/15/18 IMPRESSIONS  1. The left ventricle has low normal systolic function of 50-55%.There is akinesis of the apical inferolateral, inferior, lateral, anterior and septal left ventricular segments. 2. The right ventricle has normal systolic function. The cavity was normal. There is no increase in right ventricular wall thickness. 3. The mitral valve is normal in structure. There is mild mitral  annular calcification present. 4. The tricuspid valve is normal in structure. 5. The aortic valve has an indeterminant number of cusps There is moderate thickening and mild calcification of the aortic valve, with moderately decreased cusp excursion. 6. The pulmonic valve was normal in structure.   Patient Profile     68 y.o. male w/ h/o CAD, followed at [facility], s/p CABG in 2015, post operative atrial fibrillation, HTN, HLD, Diabetes, CKD and prior CVA w/ subsequent expressive dysphasia, admitted for septic shock of unknown source. Cardiology was consulted for elevated troponin. Also noted to have intermittedNSVT on tele as well as brief run of atrial flutter w/ RVR 02/18/18.   Assessment & Plan    Septic shock of unknown source -Work-up including blood cultures was negative.  Sepsis improved with antibiotics.  Mild leukocytosis has improved.  Medical management per primary team.  Elevated troponin -Troponin peaked at 6.95. -The patient has been chest pain-free. -Echo on 02/15/2018 showed preserved LVEF but wall motion abnormalities.  The patient's prior studies were at [facility] and the actual films are not available for comparison. -He has had arrhythmias on telemetry, NSVT and atrial flutter. -He is currently being treated with IV heparin. -With his prior known CAD, elevated troponins and wall motion abnormalities we would recommend cardiac catheterization. -We will plan for cardiac catheterization on Monday.  The patient is in agreement but it is unclear whether he fully understands.  According to our social worker the person that would help make decisions with the patient is his ex-wife with whom he lives, Philip [name].  The patient also gives me permission to speak to Philip [name]. I called her to discuss the cath and got no answer.  I left a brief message that we would attempt to call back later. [phone])  CAD -History of CABG in 08/2013.  Followed by cardiology at [facility].  -Patient with no chest pain but above findings concerning for ACS.  Patient could have silent ischemia in setting of diabetes. -Patient on aspirin, Plavix, statin.  Blood pressure too soft for addition of beta-blocker.  Atrial flutter with RVR -First occurrence 02/18/2018.  Ventricular rates in the 140s but asymptomatic.  He spontaneously converted back to sinus rhythm without intervention. -Maintaining normal sinus rhythm -Patient has a history of CVA.  He is currently on IV heparin.  The patient would benefit from long-term anticoagulation for stroke risk reduction but there is the question of his cognitive baseline and medical compliance.  This would not be an issue if the patient is ultimately placed in a facility.  NSVT - Currently maintaining sinus rhythm with occasional PVCs  AKI -Peak serum creatinine was 1.94 and is trended down to normal at 1.05 today.   Cognitive -The patient has apparently had a stroke about 3 years ago and has had subsequent expressive dysphasia. -Social worker has been in contact with the patient's ex-wife, Darel HongJudy, with whom he lives.  Darel HongJudy is apparently the person to make decisions with/for the patient.  The patient apparently has an adult daughter but she is living in a hotel and does not have a phone.  Hyperlipidemia -His hepatic enzymes have returned to normal. Will start statin for CAD/ NSTEMI. LDL is 161 mg/dL. Will start Lipitor 80. LDL goal is < 70 mg/dL. He will need to f/u with his primary cardiologist in 6-8 weeks for repeat FLP and HFTs.    For questions or updates, please contact [facility] Please consult [email or web address] for contact info under        Signed, Berton BonJanine Harlene Petralia, NP  02/20/2018, 1:03 PM

## 2018-02-20 NOTE — Progress Notes (Signed)
Physical Therapy Treatment Patient Details Name: Philip [name]. MRN: [id number] DOB: 23-Feb-1950 Today's Date: 02/20/2018    History of Present Illness Patient is a 68 year old male admitted with nausea, vomiting, diarreah, increased lethargy and confusion. Found to have leukocytosis, septic shock.  PMH to include: HTN, CKD, DM, CAD s/p CABG, CVA with L side weakness.        PT Comments    Patient received in bed with RN present. Agrees to work with PT. Encouraged to sit up in recliner at end of session. Patient reluctantly agreed. Chair alarm in place. Patient continues to require cues for safety, processing, problem solving. Able to increase gait distance this visit. Patient will continue to benefit from skilled PT to address weakness and decreased functional independence. Recommending SNF at this time as mental status is impaired and unsure of home situation.       Follow Up Recommendations  SNF     Equipment Recommendations  Rolling walker with 5" wheels    Recommendations for Other Services       Precautions / Restrictions Precautions Precautions: Fall Restrictions Weight Bearing Restrictions: No    Mobility  Bed Mobility Overal bed mobility: Modified Independent             General bed mobility comments: use of bed rails  Transfers Overall transfer level: Needs assistance Equipment used: Rolling walker (2 wheeled) Transfers: Sit to/from Stand Sit to Stand: Min guard         General transfer comment: Cues needed for safety with transfers, hand placement  Ambulation/Gait Ambulation/Gait assistance: Min guard Gait Distance (Feet): 40 Feet Assistive device: Rolling walker (2 wheeled) Gait Pattern/deviations: Step-through pattern     General Gait Details: patient slightly unsteady with ambulation. Runs walker into furniture in room and requires cues and assist to problem solve.   Stairs             Wheelchair Mobility    Modified Rankin  (Stroke Patients Only)       Balance Overall balance assessment: Mild deficits observed, not formally tested                                          Cognition Arousal/Alertness: Awake/alert   Overall Cognitive Status: No family/caregiver present to determine baseline cognitive functioning Area of Impairment: Safety/judgement;Orientation;Attention;Memory;Awareness;Problem solving;Following commands                 Orientation Level: Disoriented to;Place;Time;Situation   Memory: Decreased short-term memory Following Commands: Follows one step commands inconsistently Safety/Judgement: Decreased awareness of safety   Problem Solving: Slow processing;Difficulty sequencing;Requires verbal cues General Comments: patient unable to follow directions consistently or answer questions consistently. No family present      Exercises      General Comments        Pertinent Vitals/Pain Pain Assessment: No/denies pain    Home Living                      Prior Function            PT Goals (current goals can now be found in the care plan section) Acute Rehab PT Goals Patient Stated Goal: none stated PT Goal Formulation: Patient unable to participate in goal setting Time For Goal Achievement: 03/05/18 Progress towards PT goals: Progressing toward goals    Frequency    Min  2X/week      PT Plan Current plan remains appropriate    Co-evaluation              AM-PAC PT "6 Clicks" Mobility   Outcome Measure  Help needed turning from your back to your side while in a flat bed without using bedrails?: A Little Help needed moving from lying on your back to sitting on the side of a flat bed without using bedrails?: A Little Help needed moving to and from a bed to a chair (including a wheelchair)?: A Little Help needed standing up from a chair using your arms (e.g., wheelchair or bedside chair)?: A Little Help needed to walk in hospital  room?: A Little Help needed climbing 3-5 steps with a railing? : A Lot 6 Click Score: 17    End of Session Equipment Utilized During Treatment: Gait belt Activity Tolerance: Patient tolerated treatment well Patient left: in chair;with call bell/phone within reach;with chair alarm set Nurse Communication: Mobility status PT Visit Diagnosis: Muscle weakness (generalized) (M62.81);Unsteadiness on feet (R26.81);Difficulty in walking, not elsewhere classified (R26.2)     Time: 4158-3094 PT Time Calculation (min) (ACUTE ONLY): 26 min  Charges:  $Gait Training: 8-22 mins $Therapeutic Activity: 8-22 mins                     Ottis Sarnowski, PT, GCS 02/20/18,12:49 PM

## 2018-02-20 NOTE — Progress Notes (Signed)
  Date: 02/20/2018  Patient name: Philip [name].  Medical record number: [id number]  Date of birth: [birth year]   I have seen and evaluated this patient and I have discussed the plan of care with the house [place]. Please see Dr. Sylvester Harder note for complete details. I concur with her findings and plan.     Inez Catalina, MD 02/20/2018, 4:11 PM

## 2018-02-20 NOTE — NC FL2 (Signed)
[place] MEDICAID FL2 LEVEL OF CARE SCREENING TOOL     IDENTIFICATION  Patient Name: Philip [name]. Birthdate: [birth year] Sex: male Admission Date (Current Location): 02/15/2018  [employer] and [place] Number:  Producer, television/film/video and Address:  [facility] [place], [address] [place], [place] [address]      Provider Number: [id number]  Attending Physician Name and Address:  Inez Catalina, MD  Relative Name and Phone Number:  Carlyle Lipa, [phone]    Current Level of Care: Hospital Recommended Level of Care: Skilled Nursing Facility Prior Approval Number:    Date Approved/Denied:   PASRR Number: [id number]  Discharge Plan: SNF    Current Diagnoses: Patient Active Problem List   Diagnosis Date Noted  . Sepsis (HCC)   . Hypotension   . Hypoxemia   . Sinus tachycardia   . Severe sepsis (HCC)   . Acute respiratory failure with hypoxia (HCC)   . Shock (HCC) 02/15/2018  . Acute kidney injury (nontraumatic) (HCC)   . Dehydration   . Leukocytosis   . Nausea vomiting and diarrhea   . Weight loss 10/19/2016  . CKD (chronic kidney disease), stage III (HCC) 10/19/2016  . Essential hypertension 10/19/2016  . CAD (coronary artery disease) of artery bypass graft 10/19/2016  . GERD (gastroesophageal reflux disease) 10/19/2016  . Elevated transaminase level   . Hypoglycemia 10/18/2016    Orientation RESPIRATION BLADDER Height & Weight     Self, Place  Normal Continent, Indwelling catheter Weight: 78.4 kg Height:  5\' 8"  (172.7 cm)(will verify)  BEHAVIORAL SYMPTOMS/MOOD NEUROLOGICAL BOWEL NUTRITION STATUS  (None)   Incontinent Diet(Please see DC Summary)  AMBULATORY STATUS COMMUNICATION OF NEEDS Skin   Extensive Assist Verbally Normal                       Personal Care Assistance Level of Assistance  Bathing, Feeding, Dressing Bathing Assistance: Maximum assistance Feeding assistance: Limited assistance Dressing Assistance: Limited  assistance     Functional Limitations Info  Sight, Hearing, Speech Sight Info: Adequate Hearing Info: Adequate Speech Info: Adequate    SPECIAL CARE FACTORS FREQUENCY  PT (By licensed PT), OT (By licensed OT), Speech therapy     PT Frequency: 5x/week OT Frequency: 3x/week     Speech Therapy Frequency: 3x/week      Contractures Contractures Info: Not present    Additional Factors Info  Code Status, Allergies, Isolation Precautions, Psychotropic Code Status Info: Full Allergies Info: Codeine Psychotropic Info: Celexa   Isolation Precautions Info: 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 acetaminophen (TYLENOL) tablet 650 mg  650 mg Oral Q6H PRN Angelita Ingles, MD   650 mg at 02/15/18 1058   Or  . acetaminophen (TYLENOL) suppository 650 mg  650 mg Rectal Q6H PRN Angelita Ingles, MD      . aspirin EC tablet 81 mg  81 mg Oral Daily Angelita Ingles, MD   81 mg at 02/20/18 1205  . atorvastatin (LIPITOR) tablet 80 mg  80 mg Oral q1800 Allayne Butcher, PA-C   80 mg at 02/19/18 1728  . ceFEPIme (MAXIPIME) 2 g in sodium chloride 0.9 % 100 mL IVPB  2 g Intravenous Q12H Ann Held, RPH 200 mL/hr at 02/20/18 0815 2 g at 02/20/18 0815  .  citalopram (CELEXA) tablet 20 mg  20 mg Oral Daily Angelita Ingles, MD   20 mg at 02/20/18 1205  . clopidogrel (PLAVIX) tablet 75 mg  75 mg Oral Daily Angelita Ingles, MD   75 mg at 02/20/18 1205  . heparin ADULT infusion 100 units/mL (25000 units/240mL sodium chloride 0.45%)  1,500 Units/hr Intravenous Continuous Inez Catalina, MD 15 mL/hr at 02/20/18 0413 1,500 Units/hr at 02/20/18 0413  . insulin aspart (novoLOG) injection 0-15 Units  0-15 Units Subcutaneous TID WC Angelita Ingles, MD   3 Units at 02/20/18 1210  . insulin aspart (novoLOG)  injection 0-5 Units  0-5 Units Subcutaneous QHS Chana Bode B, MD      . insulin aspart (novoLOG) injection 6 Units  6 Units Subcutaneous TID WC Angelita Ingles, MD   6 Units at 02/20/18 1211  . insulin glargine (LANTUS) injection 14 Units  14 Units Subcutaneous QHS Angelita Ingles, MD   14 Units at 02/19/18 2209  . LORazepam (ATIVAN) injection 1 mg  1 mg Intravenous Q6H PRN Claudean Severance, MD      . MEDLINE mouth rinse  15 mL Mouth Rinse BID Debe Coder B, MD   15 mL at 02/20/18 1211  . mupirocin ointment (BACTROBAN) 2 % 1 application  1 application Nasal BID Inez Catalina, MD   1 application at 02/20/18 1211  . vancomycin (VANCOCIN) 1,500 mg in sodium chloride 0.9 % 500 mL IVPB  1,500 mg Intravenous Q24H Daylene Posey, RPH 250 mL/hr at 02/20/18 1206 1,500 mg at 02/20/18 1206     Discharge Medications: Please see discharge summary for a list of discharge medications.  Relevant Imaging Results:  Relevant Lab Results:   Additional Information SSN: [id number] [place], [place]

## 2018-02-20 NOTE — Progress Notes (Signed)
Occupational Therapy Treatment Patient Details Name: Philip [name]. MRN: [id number] DOB: February 05, 1950 Today's Date: 02/20/2018    History of present illness Patient is a 68 year old male admitted with nausea, vomiting, diarreah, increased lethargy and confusion. Found to have leukocytosis, septic shock.  PMH to include: HTN, CKD, DM, CAD s/p CABG, CVA with L side weakness.       OT comments  Pt making progress with functional goals. Pt continues to require min guard A for safety during ADL mobility and multimodal cues for task initiation and completion. OT will continue to follow acutely  Follow Up Recommendations  SNF;Supervision/Assistance - 24 hour    Equipment Recommendations  Other (comment)(TBD at next venue of care)    Recommendations for Other Services      Precautions / Restrictions Precautions Precautions: Fall Restrictions Weight Bearing Restrictions: No       Mobility Bed Mobility Overal bed mobility: Modified Independent             General bed mobility comments: use of bed rails  Transfers Overall transfer level: Needs assistance Equipment used: Rolling walker (2 wheeled) Transfers: Sit to/from Stand Sit to Stand: Min guard         General transfer comment: Cues needed for safety with transfers, hand placement    Balance Overall balance assessment: Mild deficits observed, not formally tested                                         ADL either performed or assessed with clinical judgement   ADL Overall ADL's : Needs assistance/impaired     Grooming: Wash/dry hands;Wash/dry face;Standing;Min guard;Brushing hair   Upper Body Bathing: Min guard;Sitting Upper Body Bathing Details (indicate cue type and reason): simulated Lower Body Bathing: Moderate assistance;Minimal assistance;Sit to/from stand;Cueing for safety Lower Body Bathing Details (indicate cue type and reason): simulated     Lower Body Dressing: Minimal  assistance   Toilet Transfer: Min guard;Cueing for safety;Cueing for sequencing;Ambulation;Regular Toilet;Grab bars   Toileting- Clothing Manipulation and Hygiene: Sit to/from stand;Cueing for sequencing;Cueing for safety;Min guard   Tub/ Shower Transfer: Min guard;Ambulation;Rolling walker;Grab bars   Functional mobility during ADLs: Min guard;Cueing for safety;Cueing for sequencing;Rolling walker General ADL Comments: pt with cognitive impairments and requires multiodal cues, instructions to be repeated and cues for safety     Vision Patient Visual Report: No change from baseline     Perception     Praxis      Cognition Arousal/Alertness: Awake/alert   Overall Cognitive Status: No family/caregiver present to determine baseline cognitive functioning Area of Impairment: Orientation;Memory;Following commands;Problem solving;Awareness;Safety/judgement                 Orientation Level: Disoriented to;Place;Time;Situation   Memory: Decreased short-term memory Following Commands: Follows one step commands inconsistently     Problem Solving: Slow processing;Difficulty sequencing;Requires verbal cues;Requires tactile cues General Comments: patient unable to follow directions consistently or answer questions consistently. No family present        Exercises     Shoulder Instructions       General Comments      Pertinent Vitals/ Pain       Pain Assessment: No/denies pain  Home Living  Prior Functioning/Environment              Frequency           Progress Toward Goals  OT Goals(current goals can now be found in the care plan section)  Progress towards OT goals: Progressing toward goals     Plan Discharge plan remains appropriate    Co-evaluation                 AM-PAC OT "6 Clicks" Daily Activity     Outcome Measure   Help from another person eating meals?: None Help from  another person taking care of personal grooming?: A Little Help from another person toileting, which includes using toliet, bedpan, or urinal?: A Lot Help from another person bathing (including washing, rinsing, drying)?: A Lot Help from another person to put on and taking off regular upper body clothing?: A Little Help from another person to put on and taking off regular lower body clothing?: A Lot 6 Click Score: 16    End of Session Equipment Utilized During Treatment: Gait belt  OT Visit Diagnosis: Unsteadiness on feet (R26.81);Other abnormalities of gait and mobility (R26.89);Muscle weakness (generalized) (M62.81);Other symptoms and signs involving cognitive function;Cognitive communication deficit (R41.841)   Activity Tolerance Patient tolerated treatment well   Patient Left in bed;with call bell/phone within reach;with bed alarm set   Nurse Communication          Time: 4235-3614 OT Time Calculation (min): 26 min  Charges:       Galen Manila 02/20/2018, 10:39 AM

## 2018-02-20 NOTE — Clinical Social Work Note (Signed)
Clinical Social Work Assessment  Patient Details  Name: Philip [name]. MRN: [id number] Date of Birth: [birth year]  Date of referral:  02/20/18               Reason for consult:  Facility Placement                Permission sought to share information with:  Facility Medical sales representative, Family Supports Permission granted to share information::  No  Name::     Aeronautical engineer::  SNFs  Relationship::  Ex-wife  Contact Information:  [phone]  Housing/Transportation Living arrangements for the past 2 months:  Single Family Home Source of Information:  Other (Comment Required)(Ex-wife) Patient Interpreter Needed:  None Criminal Activity/Legal Involvement Pertinent to Current Situation/Hospitalization:  No - Comment as needed Significant Relationships:  Adult Children, Other(Comment)(Ex-wife) Lives with:  Other (Comment)(Ex-wife) Do you feel safe going back to the place where you live?  No Need for family participation in patient care:  Yes (Comment)  Care giving concerns:  CSW received consult for possible SNF placement at time of discharge. CSW spoke with patient'd ex-wife, Darel Hong, regarding PT recommendation of SNF placement at time of discharge. Darel Hong reported that patient lives with her and she helps care for him. She is currently unable to care for patient at their home given patient's current physical needs and fall risk. CSW inquired as to who helps makes decisions for patient and she reported that she did. CSW asked about patient's daughter, Aram Beecham. Darel Hong reported that Aram Beecham does not have a phone at this time and is staying in a motel, which explains why the number Aram Beecham provided was [facility]. Darel Hong expressed understanding of PT recommendation and is agreeable to SNF placement at time of discharge. CSW to continue to follow and assist with discharge planning needs.   Social Worker assessment / plan:  CSW spoke with patient's ex-wife concerning possibility of rehab at [facility] before  returning home.  Employment status:  Retired Health and safety inspector:  Medicare PT Recommendations:  Skilled Nursing Facility Information / Referral to community resources:  Skilled Nursing Facility  Patient/Family's Response to care:  Darel Hong recognizes need for rehab before returning home and is agreeable to a SNF in [place]. Darel Hong reported preference for [facility] since [facility] is near her house.  Patient/Family's Understanding of and Emotional Response to Diagnosis, Current Treatment, and Prognosis:  Patient/family is realistic regarding therapy needs and expressed being hopeful for SNF placement. Darel Hong expressed understanding of CSW role and discharge process as well as medical condition. No questions/concerns about plan or treatment.    Emotional Assessment Appearance:  Appears stated age Attitude/Demeanor/Rapport:  Unable to Assess Affect (typically observed):  Unable to Assess Orientation:  Oriented to Self, Oriented to Place Alcohol / Substance use:  Not Applicable Psych involvement (Current and /or in the community):  No (Comment)  Discharge Needs  Concerns to be addressed:  Care Coordination Readmission within the last 30 days:  No Current discharge risk:  Cognitively Impaired, Dependent with Mobility Barriers to Discharge:  Continued Medical Work up   [employer], LCSW 02/20/2018, 12:28 PM

## 2018-02-20 NOTE — Progress Notes (Signed)
Subjective: No overnight events. Per nursing, he has been more mobile and less weak than previously. He denies dyspnea even when walking. The plan for transitioning to oral antibiotics and waiting for cath with cards was explained to him. All of his questions were answered.  Objective:  Vital signs in last 24 hours: Vitals:   02/19/18 2331 02/20/18 0403 02/20/18 0500 02/20/18 0534  BP: 130/75 115/66    Pulse: 89 90    Resp: (!) 21 15    Temp: 98.2 F (36.8 C)   98.3 F (36.8 C)  TempSrc:    Oral  SpO2: 93% 95%    Weight:   78.4 kg   Height:        General: lying comfortably in bed, no distress Cardiac: RRR, no murmurs, rubs or gallops Pulmonary: CTABL, no wheezing or rhonchi Abdomen: Soft, non-tender, non-distended Psychiatry: Alert, intermittent confusion    Assessment/Plan:  Active Problems:   Elevated transaminase level   Shock (HCC)   Severe sepsis (HCC)   Acute respiratory failure with hypoxia (HCC)   Sepsis (HCC)   Hypotension   Hypoxemia   Sinus tachycardia  This is a 68 year old male with a history of CAD s/p CABG, MCA CVA, DMII, HTN, andCKD who presented with a 1 day history of n/v, diarrhea, confusion and lethargy. Found to be hypotensive with a SBP of 70 and hypoglycemic with a CBG of 69. He was found to be febrile to 101, and hypotensive. He was fluid resuscitated however SBPs still remained low. Started on zosyn and azithromycin.  Severe hypotension 2/2emesis: Shock, likely septic 2/2 unknown source: -Blood pressures have improved significantly, he has remained afebrile with no leukocytosis.  He appears to be back to his baseline in his mental status, has a history of expressive aphasia and ex-wife reports that this is close to where he normally is.  If still not found a source, he may have had a component of her pneumonia and will continue vancomycin and cefepime x7 days.  We turned off his oxygen while in the room and is continued to oxygenate.  94 to  95%.  Overall his septic shock seems to be improving, still an unclear source however may have had pneumonia.  -Switch vancomycin and cefepime to cefdinir and doxycycline to complete 7 days (2/11-2/17), appreciate pharmacy assisting with this -Tylenol PRN for fevers -Imodium for diarrhea -PT/OT > recommends SNF -Duonebs PRN  AKI: -Resolved.  -Continue to followBMP  Hypertension:  -Patient is on metoprolol and amlodipine at home. He was admitted for septic shock and he was hypotensive. His blood pressures are still on the lower side so we will continue to hold these medications for now.  -Continue to monitor  Atrial flutter w/ RVR: Elevated troponin: H/o CAD s/p CABG: -He had an episode of atrial flutter on 12/12 with ventricular rates in the 140s, he continued to be asymptomatic.  Troponin peaked at 6.95.  Echocardiogram on 2/9 showed preserved EF with akinesis of the apical lateral.,  Lateral, anterior and septal left ventricular segments.  He has been having intermittent episodes of SVT and atrial flutter.  Cardiology following and recommends continuing IV heparin medical management of CAD.  They are considering a cardiac cath however would need family consent for this.  He is on plavix and ASA at home, patient reported that he had been taking this. -Continue Plavix and ASA -Cardiology following, appreciate recommendations -Possible catheterization on 2/17 pending consent -Conintue IV heparin  Hyperlipidemia: -Continue Lipitor  80 mg daily  Diabetes: -On insulin and metformin at home.  -ContinueSSI -Frequent CBGs  FEN:No fluids, replete lytes prn,heart healthy VTE ppx: Lovenox  Code Status: FULL  Dispo: Anticipated discharge is pending clinical improvement.   Claudean Severance, MD 02/20/2018, 6:58 AM Pager: [phone]

## 2018-02-20 NOTE — Progress Notes (Signed)
ANTICOAGULATION CONSULT NOTE - Follow-Up Consult  Pharmacy Consult for Heparin Indication: chest pain/ACS  Allergies  Allergen Reactions  . Codeine     GI upset    Patient Measurements: Height: 5\' 8"  (172.7 cm)(will verify) Weight: 172 lb 13.5 oz (78.4 kg) IBW/kg (Calculated) : 68.4 Heparin Dosing Weight: 75.2kg  Vital Signs: Temp: 98.4 F (36.9 C) (02/14 0731) Temp Source: Oral (02/14 0731) BP: 115/66 (02/14 0403) Pulse Rate: 90 (02/14 0403)  Labs: Recent Labs    02/17/18 2340 02/18/18 0914 02/18/18 2125 02/19/18 0411 02/20/18 0508  HGB 11.4*  --   --  11.0* 11.7*  HCT 35.1*  --   --  33.1* 35.5*  PLT 182  --   --  161 206  HEPARINUNFRC 0.24* 0.27* 0.50 0.43 0.65  CREATININE 1.58* 1.50*  --  1.14 1.05  TROPONINI  --  2.50*  --   --   --     Estimated Creatinine Clearance: 64.2 mL/min (by C-G formula based on SCr of 1.05 mg/dL).   Medical History: Past Medical History:  Diagnosis Date  . Diabetes mellitus (HCC)    Assessment: 73 YOM admitted on 2/9 with possible sepsis, now with troponin elevation, pharmacy is consulted to start IV heparin.   Heparin level remains therapeutic at 0.65 on 1500 units/hr, H/H stable, plts 205, no bleeding reported. Cont heparin until cath can be done.   Goal of Therapy:  Heparin level 0.3-0.7 units/ml Monitor platelets by anticoagulation protocol: Yes   Plan:  Continue heparin gtt at 1500 units/hr Daily heparin level, CBC, s/s bleeding  Ulyses Southward, PharmD, BCIDP, AAHIVP, CPP Infectious Disease Pharmacist 02/20/2018 10:09 AM

## 2018-02-21 DIAGNOSIS — E1122 Type 2 diabetes mellitus with diabetic chronic kidney disease: Secondary | ICD-10-CM

## 2018-02-21 DIAGNOSIS — Z8673 Personal history of transient ischemic attack (TIA), and cerebral infarction without residual deficits: Secondary | ICD-10-CM

## 2018-02-21 DIAGNOSIS — Z7902 Long term (current) use of antithrombotics/antiplatelets: Secondary | ICD-10-CM

## 2018-02-21 DIAGNOSIS — Z7982 Long term (current) use of aspirin: Secondary | ICD-10-CM

## 2018-02-21 DIAGNOSIS — Z951 Presence of aortocoronary bypass graft: Secondary | ICD-10-CM

## 2018-02-21 DIAGNOSIS — N189 Chronic kidney disease, unspecified: Secondary | ICD-10-CM

## 2018-02-21 DIAGNOSIS — Z794 Long term (current) use of insulin: Secondary | ICD-10-CM

## 2018-02-21 DIAGNOSIS — Z79899 Other long term (current) drug therapy: Secondary | ICD-10-CM

## 2018-02-21 DIAGNOSIS — I4892 Unspecified atrial flutter: Secondary | ICD-10-CM

## 2018-02-21 DIAGNOSIS — F0390 Unspecified dementia without behavioral disturbance: Secondary | ICD-10-CM

## 2018-02-21 DIAGNOSIS — E785 Hyperlipidemia, unspecified: Secondary | ICD-10-CM

## 2018-02-21 DIAGNOSIS — I214 Non-ST elevation (NSTEMI) myocardial infarction: Secondary | ICD-10-CM | POA: Diagnosis present

## 2018-02-21 DIAGNOSIS — I129 Hypertensive chronic kidney disease with stage 1 through stage 4 chronic kidney disease, or unspecified chronic kidney disease: Secondary | ICD-10-CM

## 2018-02-21 LAB — CBC
HCT: 33.1 % — ABNORMAL LOW (ref 39.0–52.0)
Hemoglobin: 10.9 g/dL — ABNORMAL LOW (ref 13.0–17.0)
MCH: 29.5 pg (ref 26.0–34.0)
MCHC: 32.9 g/dL (ref 30.0–36.0)
MCV: 89.7 fL (ref 80.0–100.0)
Platelets: 208 10*3/uL (ref 150–400)
RBC: 3.69 MIL/uL — AB (ref 4.22–5.81)
RDW: 13.9 % (ref 11.5–15.5)
WBC: 6.8 10*3/uL (ref 4.0–10.5)
nRBC: 0 % (ref 0.0–0.2)

## 2018-02-21 LAB — COMPREHENSIVE METABOLIC PANEL
ALT: 27 U/L (ref 0–44)
AST: 19 U/L (ref 15–41)
Albumin: 2.3 g/dL — ABNORMAL LOW (ref 3.5–5.0)
Alkaline Phosphatase: 41 U/L (ref 38–126)
Anion gap: 8 (ref 5–15)
BUN: 14 mg/dL (ref 8–23)
CO2: 26 mmol/L (ref 22–32)
Calcium: 8 mg/dL — ABNORMAL LOW (ref 8.9–10.3)
Chloride: 105 mmol/L (ref 98–111)
Creatinine, Ser: 0.91 mg/dL (ref 0.61–1.24)
GFR calc non Af Amer: >60 mL/min (ref >60)
Glucose, Bld: 171 mg/dL — ABNORMAL HIGH (ref 70–99)
Potassium: 3.3 mmol/L — ABNORMAL LOW (ref 3.5–5.1)
Sodium: 139 mmol/L (ref 135–145)
Total Bilirubin: 0.8 mg/dL (ref 0.3–1.2)
Total Protein: 5.6 g/dL — ABNORMAL LOW (ref 6.5–8.1)

## 2018-02-21 LAB — GLUCOSE, CAPILLARY
Glucose-Capillary: 164 mg/dL — ABNORMAL HIGH (ref 70–99)
Glucose-Capillary: 201 mg/dL — ABNORMAL HIGH (ref 70–99)
Glucose-Capillary: 121 mg/dL — ABNORMAL HIGH (ref 70–99)
Glucose-Capillary: 67 mg/dL — ABNORMAL LOW (ref 70–99)

## 2018-02-21 LAB — HEPARIN LEVEL (UNFRACTIONATED): Heparin Unfractionated: 0.59 IU/mL (ref 0.30–0.70)

## 2018-02-21 MED ORDER — POTASSIUM CHLORIDE CRYS ER 20 MEQ PO TBCR
40.0000 meq | EXTENDED_RELEASE_TABLET | Freq: Two times a day (BID) | ORAL | Status: AC
  Administered 2018-02-21 (×2): 40 meq via ORAL
  Filled 2018-02-21 (×2): qty 2

## 2018-02-21 NOTE — Progress Notes (Signed)
ANTICOAGULATION CONSULT NOTE - Follow-Up Consult  Pharmacy Consult for Heparin Indication: chest pain/ACS  Allergies  Allergen Reactions  . Codeine     GI upset    Patient Measurements: Height: 5\' 8"  (172.7 cm)(will verify) Weight: 172 lb 13.5 oz (78.4 kg) IBW/kg (Calculated) : 68.4 Heparin Dosing Weight: 75.2kg  Vital Signs: Temp: 98.2 F (36.8 C) (02/15 0806) Temp Source: Oral (02/15 0806) Pulse Rate: 82 (02/15 0456)  Labs: Recent Labs    02/19/18 0411 02/20/18 0508 02/21/18 0353  HGB 11.0* 11.7* 10.9*  HCT 33.1* 35.5* 33.1*  PLT 161 206 208  HEPARINUNFRC 0.43 0.65 0.59  CREATININE 1.14 1.05 0.91    Estimated Creatinine Clearance: 74.1 mL/min (by C-G formula based on SCr of 0.91 mg/dL).   Medical History: Past Medical History:  Diagnosis Date  . Diabetes mellitus (HCC)    Assessment: 61 YOM admitted on 2/9 with possible sepsis and troponin elevation with concern for NSTEMI. Pharmacy has been consulted to start IV heparin.   Heparin level remains therapeutic at 0.59 on 1500 units/hr, H/H and platelets stable, no bleeding reported. Cont heparin until cath which is tentatively scheduled for 2/17   Goal of Therapy:  Heparin level 0.3-0.7 units/ml Monitor platelets by anticoagulation protocol: Yes   Plan:  Continue heparin gtt at 1500 units/hr Monitor daily heparin level, CBC, s/s bleeding  Arvilla Market, PharmD PGY1 Pharmacy Resident Phone [phone] 02/21/2018     10:08 AM

## 2018-02-21 NOTE — Progress Notes (Signed)
  Date: 02/21/2018  Patient name: Philip [name].  Medical record number: [id number]  Date of birth: Nov 25, 1950   I have seen and evaluated this patient and I have discussed the plan of care with the house Friday. Please see their note for complete details. I concur with their findings with the following additions/corrections:   Recovering well from septic shock of unknown etiology.  Now on oral antibiotics and seems to be doing well.  NSTEMI has improved somewhat, appreciate cardiology consultation, planning for possible left heart catheterization on Monday if they can get consent.  Patient is clearly cognitively impaired by dementia and not able to consent for himself.  Jessy Oto, M.D., Ph.D. 02/21/2018, 5:00 PM

## 2018-02-21 NOTE — Progress Notes (Signed)
   Subjective: No overnight events. Continues to work with physical therapy.  Continued difficulty answering certain questions based on complexity.    Objective:  Vital signs in last 24 hours: Vitals:   02/20/18 1518 02/20/18 1800 02/21/18 0456 02/21/18 0457  BP: 124/73 114/60    Pulse: 84 87 82   Resp: 15 20    Temp: (!) 97.4 F (36.3 C) 97.6 F (36.4 C) 98 F (36.7 C) 98 F (36.7 C)  TempSrc: Oral Axillary Oral   SpO2: 99% 95% 95%   Weight:      Height:       Cardiac: normal rate and rhythm, clear s1 and s2, no murmurs, rubs or gallops Pulmonary: decreased basilar sounds otherwise CTAB, not in distress Abdominal: non distended abdomen, soft and nontender Extremities: no LE edema Psych: Alert, conversant, in good spirits, continued occasional word finding difficulty (this is his baseline)  Assessment/Plan:  Active Problems:   Elevated transaminase level   Shock (HCC)   Severe sepsis (HCC)   Acute respiratory failure with hypoxia (HCC)   Sepsis (HCC)   Hypotension   Hypoxemia   Sinus tachycardia  This is a 68 year old male with a history of CAD s/p CABG, MCA CVA, DMII, HTN, andCKD who presented with a 1 day history of n/v, diarrhea, confusion and lethargy. Found to be hypotensive with a SBP of 70 and hypoglycemic with a CBG of 69. He was found to be febrile to 101, and hypotensive. He was fluid resuscitated however SBPs still remained low. Started on empiric abx  Severe hypotension 2/2emesis: Shock, likely septic 2/2 unknown source: Septic shock has resolved,  still an unclear source however may have had pneumonia.  -Switched vancomycin and cefepime to cefdinir and doxycycline to complete 7 days (2/11-2/17), appreciate pharmacy assisting with this -Tylenol PRN for fevers -Imodium for diarrhea -PT/OT > recommends SNF -Duonebs PRN  AKI: -Resolved.  -Continue to followBMP  Hypertension:  Patient is on metoprolol and amlodipine at home. Now  normotensive -Continue to monitor  Atrial flutter w/ RVR: Elevated troponin: H/o CAD s/p CABG: He had an episode of atrial flutter on 12/12 with ventricular rates in the 140s, he continued to be asymptomatic.  Troponin peaked at 6.95.  Echocardiogram on 2/9 showed preserved EF with akinesis of the apical lateral.,  Lateral, anterior and septal left ventricular segments.  He has been having intermittent episodes of SVT and atrial flutter.  Cardiology following and recommends continuing IV heparin medical management of CAD.  They are considering a cardiac cath however would need family consent for this.  He is on plavix and ASA at home, patient reported that he had been taking this. -Continue Plavix and ASA -Cardiology following, appreciate recommendations -Possible catheterization on 2/17 pending consent -Conintue IV heparin -will consider adding back low dose bb as bp/volume status has improved  Hyperlipidemia: -Continue Lipitor 80 mg daily  Diabetes: -On insulin and metformin at home.  -Continue basal bolus insulin regimen  FEN:No fluids, replete lytes prn,heart healthy VTE ppx: on acs dose heparin Code Status: FULL  Dispo: Anticipated discharge is pending clinical improvement.   Angelita Ingles, MD 02/21/2018, 7:30 AM

## 2018-02-22 LAB — COMPREHENSIVE METABOLIC PANEL
ALT: 32 U/L (ref 0–44)
AST: 25 U/L (ref 15–41)
Albumin: 2.3 g/dL — ABNORMAL LOW (ref 3.5–5.0)
Alkaline Phosphatase: 42 U/L (ref 38–126)
Anion gap: 8 (ref 5–15)
BUN: 13 mg/dL (ref 8–23)
CO2: 26 mmol/L (ref 22–32)
CREATININE: 0.98 mg/dL (ref 0.61–1.24)
Calcium: 8.3 mg/dL — ABNORMAL LOW (ref 8.9–10.3)
Chloride: 105 mmol/L (ref 98–111)
GFR calc Af Amer: >60 mL/min (ref >60)
GFR calc non Af Amer: >60 mL/min (ref >60)
Glucose, Bld: 90 mg/dL (ref 70–99)
Potassium: 4 mmol/L (ref 3.5–5.1)
SODIUM: 139 mmol/L (ref 135–145)
Total Bilirubin: 0.7 mg/dL (ref 0.3–1.2)
Total Protein: 5.8 g/dL — ABNORMAL LOW (ref 6.5–8.1)

## 2018-02-22 LAB — GLUCOSE, CAPILLARY
GLUCOSE-CAPILLARY: 113 mg/dL — AB (ref 70–99)
Glucose-Capillary: 197 mg/dL — ABNORMAL HIGH (ref 70–99)
Glucose-Capillary: 199 mg/dL — ABNORMAL HIGH (ref 70–99)
Glucose-Capillary: 226 mg/dL — ABNORMAL HIGH (ref 70–99)

## 2018-02-22 LAB — CBC
HCT: 33 % — ABNORMAL LOW (ref 39.0–52.0)
Hemoglobin: 10.8 g/dL — ABNORMAL LOW (ref 13.0–17.0)
MCH: 29.9 pg (ref 26.0–34.0)
MCHC: 32.7 g/dL (ref 30.0–36.0)
MCV: 91.4 fL (ref 80.0–100.0)
Platelets: 215 10*3/uL (ref 150–400)
RBC: 3.61 MIL/uL — ABNORMAL LOW (ref 4.22–5.81)
RDW: 14.4 % (ref 11.5–15.5)
WBC: 6.3 10*3/uL (ref 4.0–10.5)
nRBC: 0.5 % — ABNORMAL HIGH (ref 0.0–0.2)

## 2018-02-22 LAB — HEPARIN LEVEL (UNFRACTIONATED)
Heparin Unfractionated: <0.1 IU/mL — ABNORMAL LOW (ref 0.30–0.70)
Heparin Unfractionated: 0.31 IU/mL (ref 0.30–0.70)

## 2018-02-22 MED ORDER — SODIUM CHLORIDE 0.9 % WEIGHT BASED INFUSION: 3.0000 mL/kg/h | INTRAVENOUS | Status: AC

## 2018-02-22 MED ORDER — SODIUM CHLORIDE 0.9% FLUSH
3.0000 mL | Freq: Two times a day (BID) | INTRAVENOUS | Status: DC
  Administered 2018-02-22: 3 mL via INTRAVENOUS

## 2018-02-22 MED ORDER — SODIUM CHLORIDE 0.9 % IV SOLN: 250.0000 mL | INTRAVENOUS | Status: DC | PRN

## 2018-02-22 MED ORDER — METOPROLOL TARTRATE 12.5 MG HALF TABLET
12.5000 mg | ORAL_TABLET | Freq: Two times a day (BID) | ORAL | Status: DC
  Administered 2018-02-22 – 2018-02-23 (×3): 12.5 mg via ORAL
  Filled 2018-02-22 (×3): qty 1

## 2018-02-22 MED ORDER — SODIUM CHLORIDE 0.9% FLUSH: 3.0000 mL | INTRAVENOUS | Status: DC | PRN

## 2018-02-22 MED ORDER — SODIUM CHLORIDE 0.9 % WEIGHT BASED INFUSION
1.0000 mL/kg/h | INTRAVENOUS | Status: DC
  Administered 2018-02-23: 1 mL/kg/h via INTRAVENOUS

## 2018-02-22 NOTE — Progress Notes (Signed)
ANTICOAGULATION CONSULT NOTE - Follow-Up Consult  Pharmacy Consult for Heparin Indication: chest pain/ACS  Allergies  Allergen Reactions  . Codeine     GI upset    Patient Measurements: Height: 5\' 8"  (172.7 cm)(will verify) Weight: 172 lb 13.5 oz (78.4 kg) IBW/kg (Calculated) : 68.4 Heparin Dosing Weight: 75.2kg  Vital Signs: Temp: 98.6 F (37 C) (02/16 0636) BP: 130/65 (02/16 0636) Pulse Rate: 77 (02/16 0636)  Labs: Recent Labs    02/20/18 0508 02/21/18 0353 02/22/18 0500 02/22/18 0924  HGB 11.7* 10.9* 10.8*  --   HCT 35.5* 33.1* 33.0*  --   PLT 206 208 215  --   HEPARINUNFRC 0.65 0.59 <0.10* <0.10*  CREATININE 1.05 0.91 0.98  --     Estimated Creatinine Clearance: 68.8 mL/min (by C-G formula based on SCr of 0.98 mg/dL).   Medical History: Past Medical History:  Diagnosis Date  . Diabetes mellitus (HCC)    Assessment: 20 YOM admitted on 2/9 with possible sepsis and troponin elevation with concern for NSTEMI. Pharmacy has been consulted to start IV heparin.   Heparin level dropped to <0.10 today. Confirmed with recheck. Unclear as to why patient is now subtherapeutic as he had been stable and therapeutic on 1500 units/hr for multiple days. No issues with infusion or bleeding noted per RN. Will slightly increase heparin drip rate. Cath tomorrow.  Goal of Therapy:  Heparin level 0.3-0.7 units/ml Monitor platelets by anticoagulation protocol: Yes   Plan:  Increase heparin gtt to 1600 units/hr Check 6 hour heparin level Monitor daily heparin level, CBC, s/s bleeding  Arvilla Market, PharmD PGY1 Pharmacy Resident Phone [phone] 02/22/2018     11:07 AM

## 2018-02-22 NOTE — Progress Notes (Signed)
  Date: 02/22/2018  Patient name: Philip [name].  Medical record number: [id number]  Date of birth: 1950/09/13   I have seen and evaluated this patient and I have discussed the plan of care with the house [place]. Please see their note for complete details. I concur with their findings with the following additions/corrections:   Continues to do well on antibiotics for septic shock, unclear source, but treating for presumed pneumonia.  Significant NSTEMI, possibly demand ischemia, but concern for silent ischemia in the setting of diabetes, wall motion abnormalities previously noted on TTE, cardiology planning for left heart cath tomorrow, consent obtained.  Appreciate their assistance.  Jessy Oto, M.D., Ph.D. 02/22/2018, 1:22 PM

## 2018-02-22 NOTE — Progress Notes (Signed)
Spoke with the patient's ex-wife, [name] [phone], who is the patient's HCPOA per social work given the patient's expressive dysphagia and confusion regarding important decisions. Ms. Orvan Falconer has spoken with the patient's grown children (they do not live locally) and reports they are ok with proceeding with cath during this admission. I have explained the risks and benefits of cardiac catheterization with the patient's ex wife (HCPOA) including risks of bleeding, bruising, infection, kidney damage, injury to a limb, stroke, heart attack, urgent need for bypass, and death. The patient's HCPOA understands these risks and is willing to proceed with the procedure. All questions have been answered and concerns listened to.   Labs reviewed from today show a stable HGB of 10.8, PLT count 215, SCr 0.98, K+ 4.0. BP is much improved in the 130s systolic.   Patient's HCPOA does have concerns regarding long term care of the patient moving forward. Perhaps social work can continue to assist with this.   The patient will be made NPO at midnight for cath on 02/23/2018. Orders have been placed and the patient has been added to the cath board.   The patient's HCPOA will be at the hospital at 7 AM in preparation for the cath.    Eula Listen, PA-C 02/22/2018 10:30 AM

## 2018-02-22 NOTE — H&P (View-Only) (Signed)
Progress Note  Patient Name: Philip [name]. Date of Encounter: 02/22/2018  Primary Cardiologist: Dr. Newt Lukes, [facility]  Subjective   No complaints today, awake, alert. Reviewed plans for cath, and Eula Listen PA has communicated with his ex-wife for consent (see note).  Inpatient Medications    Scheduled Meds: . aspirin EC  81 mg Oral Daily  . atorvastatin  80 mg Oral q1800  . cefdinir  600 mg Oral Daily  . citalopram  20 mg Oral Daily  . clopidogrel  75 mg Oral Daily  . doxycycline  100 mg Oral Q12H  . insulin aspart  0-15 Units Subcutaneous TID WC  . insulin aspart  0-5 Units Subcutaneous QHS  . insulin aspart  6 Units Subcutaneous TID WC  . insulin glargine  14 Units Subcutaneous QHS  . mouth rinse  15 mL Mouth Rinse BID  . metoprolol tartrate  12.5 mg Oral BID   Continuous Infusions: . heparin 1,500 Units/hr (02/21/18 1014)   PRN Meds: acetaminophen **OR** acetaminophen, LORazepam   Vital Signs    Vitals:   02/21/18 1800 02/21/18 2054 02/21/18 2140 02/22/18 0636  BP:    130/65  Pulse: 77   77  Resp: (!) 24   19  Temp:  99 F (37.2 C) 98.9 F (37.2 C) 98.6 F (37 C)  TempSrc:  Oral Oral   SpO2: 94%   100%  Weight:      Height:        Intake/Output Summary (Last 24 hours) at 02/22/2018 1155 Last data filed at 02/22/2018 0900 Gross per 24 hour  Intake 720 ml  Output 650 ml  Net 70 ml   Last 3 Weights 02/20/2018 02/19/2018 02/18/2018  Weight (lbs) 172 lb 13.5 oz 209 lb 10.5 oz 167 lb 12.3 oz  Weight (kg) 78.4 kg 95.1 kg 76.1 kg      Telemetry    Sinus rhythm in the 80s with occasional PVCs- Personally Reviewed  ECG    No new tracings since 2/11- Personally Reviewed  Physical Exam   GEN: No acute distress.   Neck: No JVD Cardiac: RRR, no murmurs, rubs, or gallops.  Respiratory: Clear to auscultation bilaterally. GI: Soft, nontender, non-distended  MS: No edema; No deformity. Neuro:   Mildly cognitively impaired Psych: Normal affect   Labs      Chemistry Recent Labs  Lab 02/19/18 0411 02/20/18 0508 02/21/18 0353 02/22/18 0500  NA 138 138 139 139  K 3.7 3.7 3.3* 4.0  CL 101 100 105 105  CO2 26 28 26 26   GLUCOSE 202* 194* 171* 90  BUN 17 15 14 13   CREATININE 1.14 1.05 0.91 0.98  CALCIUM 7.8* 8.2* 8.0* 8.3*  PROT 5.6*  --  5.6* 5.8*  ALBUMIN 2.4*  --  2.3* 2.3*  AST 21  --  19 25  ALT 30  --  27 32  ALKPHOS 43  --  41 42  BILITOT 0.9  --  0.8 0.7  GFRNONAA >60 >60 >60 >60  GFRAA >60 >60 >60 >60  ANIONGAP 11 10 8 8      Hematology Recent Labs  Lab 02/20/18 0508 02/21/18 0353 02/22/18 0500  WBC 7.0 6.8 6.3  RBC 3.90* 3.69* 3.61*  HGB 11.7* 10.9* 10.8*  HCT 35.5* 33.1* 33.0*  MCV 91.0 89.7 91.4  MCH 30.0 29.5 29.9  MCHC 33.0 32.9 32.7  RDW 13.9 13.9 14.4  PLT 206 208 215    Cardiac Enzymes Recent Labs  Lab 02/16/18  1529 02/16/18 1915 02/17/18 0013 02/18/18 0914  TROPONINI 2.38* 5.24* 6.95* 2.50*   No results for input(s): TROPIPOC in the last 168 hours.   BNP No results for input(s): BNP, PROBNP in the last 168 hours.   DDimer No results for input(s): DDIMER in the last 168 hours.   Radiology    No results found.  Cardiac Studies   2D Echo 02/15/18 IMPRESSIONS  1. The left ventricle has low normal systolic function of 50-55%.There is akinesis of the apical inferolateral, inferior, lateral, anterior and septal left ventricular segments. 2. The right ventricle has normal systolic function. The cavity was normal. There is no increase in right ventricular wall thickness. 3. The mitral valve is normal in structure. There is mild mitral annular calcification present. 4. The tricuspid valve is normal in structure. 5. The aortic valve has an indeterminant number of cusps There is moderate thickening and mild calcification of the aortic valve, with moderately decreased cusp excursion. 6. The pulmonic valve was normal in structure.   Patient Profile     68 y.o. male w/ h/o CAD, followed at  [facility], s/p CABG in 2015, post operative atrial fibrillation, HTN, HLD, Diabetes, CKD and prior CVA w/ subsequent expressive dysphasia, admitted for septic shock of unknown source. Cardiology was consulted for elevated troponin. Also noted to have intermittedNSVT on tele as well as brief run of atrial flutter w/ RVR 02/18/18.   Assessment & Plan    Septic shock of unknown source -Work-up including blood cultures was negative.  Sepsis improved with antibiotics.  Mild leukocytosis has improved.  Medical management per primary team. Per notes, last day of antibiotics 2/17.  Elevated troponin, rise/fall consistent with NSTEMI -Troponin peaked at 6.95. -The patient has been chest pain-free. -Echo on 02/15/2018 showed preserved LVEF but wall motion abnormalities.  The patient's prior studies were at [facility] and the actual films are not available for comparison. -He has had arrhythmias on telemetry, including NSVT and atrial flutter, self limited. -He is currently being treated with IV heparin. -With his prior known CAD, elevated troponins and wall motion abnormalities we would recommend cardiac catheterization. -We will plan for cardiac catheterization tomorrow. See phone note from Eula Listen today for consent from family.  CAD -History of CABG in 08/2013.  Followed by cardiology at [facility] health.  -Patient with no chest pain but above findings concerning for ACS.  Patient could have silent ischemia in setting of diabetes. Given no clear infectious source for shock, may have been a silent event. -Patient on aspirin, Plavix, statin.  Blood pressure improving today, on low dose metoprolol.  Atrial flutter with RVR -First occurrence 02/18/2018.  Ventricular rates in the 140s but asymptomatic.  He spontaneously converted back to sinus rhythm without intervention. -Maintaining normal sinus rhythm -Patient has a history of CVA.  He is currently on IV heparin.  The patient would benefit  from long-term anticoagulation for stroke risk reduction but there is the question of his cognitive baseline and medical compliance.  This would not be an issue if the patient is ultimately placed in a facility.  NSVT - Currently maintaining sinus rhythm with occasional PVCs  AKI -Peak serum creatinine was 1.94 and is trended down to normal at 0.98 today.   Hyperlipidemia -His hepatic enzymes have returned to normal. Startedt statin for CAD/ NSTEMI. LDL is 161 mg/dL, so started atorvastatin 80 mg. LDL goal is < 70 mg/dL. He will need to f/u with his primary cardiologist in  6-8 weeks for repeat FLP and HFTs.   TIME SPENT WITH PATIENT: 15 minutes of direct patient care. More than 50% of that time was spent on coordination of care and counseling regarding plans for cath.  Juanita Streight, MD, PhD [place]  [facility]   For questions or updates, please contact [facility] Please consult [email or web address] for contact info under     Signed, Sadiya Durand, MD  02/22/2018, 11:55 AM    

## 2018-02-22 NOTE — Progress Notes (Signed)
Progress Note  Patient Name: Philip [name]. Date of Encounter: 02/22/2018  Primary Cardiologist: Dr. Newt Lukes, [facility]  Subjective   No complaints today, awake, alert. Reviewed plans for cath, and Eula Listen PA has communicated with his ex-wife for consent (see note).  Inpatient Medications    Scheduled Meds: . aspirin EC  81 mg Oral Daily  . atorvastatin  80 mg Oral q1800  . cefdinir  600 mg Oral Daily  . citalopram  20 mg Oral Daily  . clopidogrel  75 mg Oral Daily  . doxycycline  100 mg Oral Q12H  . insulin aspart  0-15 Units Subcutaneous TID WC  . insulin aspart  0-5 Units Subcutaneous QHS  . insulin aspart  6 Units Subcutaneous TID WC  . insulin glargine  14 Units Subcutaneous QHS  . mouth rinse  15 mL Mouth Rinse BID  . metoprolol tartrate  12.5 mg Oral BID   Continuous Infusions: . heparin 1,500 Units/hr (02/21/18 1014)   PRN Meds: acetaminophen **OR** acetaminophen, LORazepam   Vital Signs    Vitals:   02/21/18 1800 02/21/18 2054 02/21/18 2140 02/22/18 0636  BP:    130/65  Pulse: 77   77  Resp: (!) 24   19  Temp:  99 F (37.2 C) 98.9 F (37.2 C) 98.6 F (37 C)  TempSrc:  Oral Oral   SpO2: 94%   100%  Weight:      Height:        Intake/Output Summary (Last 24 hours) at 02/22/2018 1155 Last data filed at 02/22/2018 0900 Gross per 24 hour  Intake 720 ml  Output 650 ml  Net 70 ml   Last 3 Weights 02/20/2018 02/19/2018 02/18/2018  Weight (lbs) 172 lb 13.5 oz 209 lb 10.5 oz 167 lb 12.3 oz  Weight (kg) 78.4 kg 95.1 kg 76.1 kg      Telemetry    Sinus rhythm in the 80s with occasional PVCs- Personally Reviewed  ECG    No new tracings since 2/11- Personally Reviewed  Physical Exam   GEN: No acute distress.   Neck: No JVD Cardiac: RRR, no murmurs, rubs, or gallops.  Respiratory: Clear to auscultation bilaterally. GI: Soft, nontender, non-distended  MS: No edema; No deformity. Neuro:   Mildly cognitively impaired Psych: Normal affect   Labs      Chemistry Recent Labs  Lab 02/19/18 0411 02/20/18 0508 02/21/18 0353 02/22/18 0500  NA 138 138 139 139  K 3.7 3.7 3.3* 4.0  CL 101 100 105 105  CO2 26 28 26 26   GLUCOSE 202* 194* 171* 90  BUN 17 15 14 13   CREATININE 1.14 1.05 0.91 0.98  CALCIUM 7.8* 8.2* 8.0* 8.3*  PROT 5.6*  --  5.6* 5.8*  ALBUMIN 2.4*  --  2.3* 2.3*  AST 21  --  19 25  ALT 30  --  27 32  ALKPHOS 43  --  41 42  BILITOT 0.9  --  0.8 0.7  GFRNONAA >60 >60 >60 >60  GFRAA >60 >60 >60 >60  ANIONGAP 11 10 8 8      Hematology Recent Labs  Lab 02/20/18 0508 02/21/18 0353 02/22/18 0500  WBC 7.0 6.8 6.3  RBC 3.90* 3.69* 3.61*  HGB 11.7* 10.9* 10.8*  HCT 35.5* 33.1* 33.0*  MCV 91.0 89.7 91.4  MCH 30.0 29.5 29.9  MCHC 33.0 32.9 32.7  RDW 13.9 13.9 14.4  PLT 206 208 215    Cardiac Enzymes Recent Labs  Lab 02/16/18  1529 02/16/18 1915 02/17/18 0013 02/18/18 0914  TROPONINI 2.38* 5.24* 6.95* 2.50*   No results for input(s): TROPIPOC in the last 168 hours.   BNP No results for input(s): BNP, PROBNP in the last 168 hours.   DDimer No results for input(s): DDIMER in the last 168 hours.   Radiology    No results found.  Cardiac Studies   2D Echo 02/15/18 IMPRESSIONS  1. The left ventricle has low normal systolic function of 50-55%.There is akinesis of the apical inferolateral, inferior, lateral, anterior and septal left ventricular segments. 2. The right ventricle has normal systolic function. The cavity was normal. There is no increase in right ventricular wall thickness. 3. The mitral valve is normal in structure. There is mild mitral annular calcification present. 4. The tricuspid valve is normal in structure. 5. The aortic valve has an indeterminant number of cusps There is moderate thickening and mild calcification of the aortic valve, with moderately decreased cusp excursion. 6. The pulmonic valve was normal in structure.   Patient Profile     68 y.o. male w/ h/o CAD, followed at  [facility], s/p CABG in 2015, post operative atrial fibrillation, HTN, HLD, Diabetes, CKD and prior CVA w/ subsequent expressive dysphasia, admitted for septic shock of unknown source. Cardiology was consulted for elevated troponin. Also noted to have intermittedNSVT on tele as well as brief run of atrial flutter w/ RVR 02/18/18.   Assessment & Plan    Septic shock of unknown source -Work-up including blood cultures was negative.  Sepsis improved with antibiotics.  Mild leukocytosis has improved.  Medical management per primary team. Per notes, last day of antibiotics 2/17.  Elevated troponin, rise/fall consistent with NSTEMI -Troponin peaked at 6.95. -The patient has been chest pain-free. -Echo on 02/15/2018 showed preserved LVEF but wall motion abnormalities.  The patient's prior studies were at [facility] and the actual films are not available for comparison. -He has had arrhythmias on telemetry, including NSVT and atrial flutter, self limited. -He is currently being treated with IV heparin. -With his prior known CAD, elevated troponins and wall motion abnormalities we would recommend cardiac catheterization. -We will plan for cardiac catheterization tomorrow. See phone note from Eula Listen today for consent from family.  CAD -History of CABG in 08/2013.  Followed by cardiology at [facility] health.  -Patient with no chest pain but above findings concerning for ACS.  Patient could have silent ischemia in setting of diabetes. Given no clear infectious source for shock, may have been a silent event. -Patient on aspirin, Plavix, statin.  Blood pressure improving today, on low dose metoprolol.  Atrial flutter with RVR -First occurrence 02/18/2018.  Ventricular rates in the 140s but asymptomatic.  He spontaneously converted back to sinus rhythm without intervention. -Maintaining normal sinus rhythm -Patient has a history of CVA.  He is currently on IV heparin.  The patient would benefit  from long-term anticoagulation for stroke risk reduction but there is the question of his cognitive baseline and medical compliance.  This would not be an issue if the patient is ultimately placed in a facility.  NSVT - Currently maintaining sinus rhythm with occasional PVCs  AKI -Peak serum creatinine was 1.94 and is trended down to normal at 0.98 today.   Hyperlipidemia -His hepatic enzymes have returned to normal. Startedt statin for CAD/ NSTEMI. LDL is 161 mg/dL, so started atorvastatin 80 mg. LDL goal is < 70 mg/dL. He will need to f/u with his primary cardiologist in  6-8 weeks for repeat FLP and HFTs.   TIME SPENT WITH PATIENT: 15 minutes of direct patient care. More than 50% of that time was spent on coordination of care and counseling regarding plans for cath.  Jodelle RedBridgette Josefina Rynders, MD, PhD [facility]  [facility]   For questions or updates, please contact [facility] Please consult [email or web address] for contact info under     Signed, Jodelle RedBridgette Chandi Nicklin, MD  02/22/2018, 11:55 AM

## 2018-02-22 NOTE — Progress Notes (Signed)
ANTICOAGULATION CONSULT NOTE - Follow-Up Consult  Pharmacy Consult for Heparin Indication: chest pain/ACS  Allergies  Allergen Reactions  . Codeine     GI upset    Patient Measurements: Height: 5\' 8"  (172.7 cm)(will verify) Weight: 172 lb 13.5 oz (78.4 kg) IBW/kg (Calculated) : 68.4 Heparin Dosing Weight: 75.2kg  Vital Signs: Temp: 97.9 F (36.6 C) (02/16 1922) Temp Source: Oral (02/16 1922) BP: 125/52 (02/16 1733) Pulse Rate: 80 (02/16 1733)  Labs: Recent Labs    02/20/18 0508 02/21/18 0353 02/22/18 0500 02/22/18 0924 02/22/18 1809  HGB 11.7* 10.9* 10.8*  --   --   HCT 35.5* 33.1* 33.0*  --   --   PLT 206 208 215  --   --   HEPARINUNFRC 0.65 0.59 <0.10* <0.10* 0.31  CREATININE 1.05 0.91 0.98  --   --     Estimated Creatinine Clearance: 68.8 mL/min (by C-G formula based on SCr of 0.98 mg/dL).   Medical History: Past Medical History:  Diagnosis Date  . Diabetes mellitus (HCC)    Assessment: 58 YOM admitted on 2/9 with possible sepsis and troponin elevation with concern for NSTEMI. Pharmacy has been consulted to start IV heparin.   Heparin level therapeutic at 0.31 after increase to 1600 units/hr.  CBC is stable. No bleeding noted.   Goal of Therapy:  Heparin level 0.3-0.7 units/ml Monitor platelets by anticoagulation protocol: Yes   Plan:  Continue heparin gtt to 1600 units/hr Monitor daily heparin level, CBC, s/s bleeding Plan for heart cath on Monday  Link Snuffer, PharmD, BCPS, BCCCP Clinical Pharmacist Please refer to [facility] for [facility] Pharmacy numbers 02/22/2018     8:09 PM

## 2018-02-22 NOTE — Progress Notes (Signed)
   Subjective: No overnight events. More alert and communicative today.  Denies any cp or other complaints.    Objective:  Vital signs in last 24 hours: Vitals:   02/21/18 1800 02/21/18 2054 02/21/18 2140 02/22/18 0636  BP:    130/65  Pulse: 77   77  Resp: (!) 24   19  Temp:  99 F (37.2 C) 98.9 F (37.2 C) 98.6 F (37 C)  TempSrc:  Oral Oral   SpO2: 94%   100%  Weight:      Height:       Cardiac: normal rate and rhythm, clear s1 and s2, no murmurs, rubs or gallops Pulmonary: decreased basilar sounds otherwise CTAB, not in distress Abdominal: non distended abdomen, soft and nontender Extremities: no LE edema Psych: Alert, conversant, in good spirits, mental status seems to be what was described as his baseline, occasional word finding difficulty/difficulty with comprehension  Assessment/Plan:  Principal Problem:   NSTEMI (non-ST elevated myocardial infarction) (HCC) Active Problems:   Shock (HCC)   Severe sepsis (HCC)   Acute respiratory failure with hypoxia (HCC)   Sepsis (HCC)   Hypotension   Hypoxemia   Sinus tachycardia  This is a 68 year old male with a history of CAD s/p CABG, MCA CVA, DMII, HTN, andCKD who presented with a 1 day history of n/v, diarrhea, confusion and lethargy. Found to be hypotensive with a SBP of 70 and hypoglycemic with a CBG of 69. He was found to be febrile to 101, and hypotensive. He was fluid resuscitated however SBPs still remained low. Started on empiric abx  Severe hypotension 2/2emesis: Shock, likely septic 2/2 unknown source: Septic shock has resolved,  still an unclear source however may have had pneumonia.  -Switched vancomycin and cefepime to cefdinir and doxycycline to complete 7 days (2/11-2/17), appreciate pharmacy assisting with this -Tylenol PRN for fevers -Imodium for diarrhea -PT/OT > recommends SNF -Duonebs PRN  AKI: -Resolved.  -Continue to followBMP  Hypertension:  Patient is on metoprolol and amlodipine at  home. Now normotensive -added back metoprolol but lower dose, continue to monitor  Atrial flutter w/ RVR: Elevated troponin: H/o CAD s/p CABG: He had an episode of atrial flutter on 12/12 with ventricular rates in the 140s, he continued to be asymptomatic.  Troponin peaked at 6.95.  Echocardiogram on 2/9 showed preserved EF with akinesis of the apical lateral.,  Lateral, anterior and septal left ventricular segments.  He has been having intermittent episodes of SVT and atrial flutter.  Cardiology following and recommends continuing IV heparin medical management of CAD.  They are considering a cardiac cath however would need family consent for this.  He is on plavix and ASA at home, patient reported that he had been taking this. -Continue Plavix and ASA -Cardiology following, appreciate recommendations -Cath scheduled 2/17 -Conintue IV heparin -add back low dose metoprolol today  Hyperlipidemia: -Continue Lipitor 80 mg daily  Diabetes: -On insulin and metformin at home.  -Continue basal bolus insulin regimen  FEN:No fluids, replete lytes prn,heart healthy VTE ppx: on acs dose heparin Code Status: FULL  Dispo: Anticipated discharge is pending clinical improvement.   Angelita Ingles, MD 02/22/2018, 11:26 AM

## 2018-02-22 NOTE — Progress Notes (Signed)
bladdder scan, 684, pt was assisted with standing, unable to urinate.  Will do I & O cath after he finishes lunch

## 2018-02-23 ENCOUNTER — Inpatient Hospital Stay (HOSPITAL_COMMUNITY)
Admission: EM | Disposition: A | Discharge status: Skilled Nursing Facility | Payer: Self-pay | Source: Home / Self Care | Attending: Internal Medicine

## 2018-02-23 DIAGNOSIS — I2510 Atherosclerotic heart disease of native coronary artery without angina pectoris: Secondary | ICD-10-CM

## 2018-02-23 DIAGNOSIS — I25810 Atherosclerosis of coronary artery bypass graft(s) without angina pectoris: Secondary | ICD-10-CM

## 2018-02-23 HISTORY — PX: LEFT HEART CATH AND CORS/GRAFTS ANGIOGRAPHY: CATH118250

## 2018-02-23 LAB — COMPREHENSIVE METABOLIC PANEL
ALT: 37 U/L (ref 0–44)
AST: 26 U/L (ref 15–41)
Albumin: 2.5 g/dL — ABNORMAL LOW (ref 3.5–5.0)
Alkaline Phosphatase: 48 U/L (ref 38–126)
Anion gap: 8 (ref 5–15)
BUN: 13 mg/dL (ref 8–23)
CO2: 26 mmol/L (ref 22–32)
Calcium: 8.5 mg/dL — ABNORMAL LOW (ref 8.9–10.3)
Chloride: 103 mmol/L (ref 98–111)
Creatinine, Ser: 0.99 mg/dL (ref 0.61–1.24)
GFR calc Af Amer: >60 mL/min (ref >60)
GFR calc non Af Amer: >60 mL/min (ref >60)
Glucose, Bld: 243 mg/dL — ABNORMAL HIGH (ref 70–99)
POTASSIUM: 4 mmol/L (ref 3.5–5.1)
Sodium: 137 mmol/L (ref 135–145)
Total Bilirubin: 1 mg/dL (ref 0.3–1.2)
Total Protein: 5.6 g/dL — ABNORMAL LOW (ref 6.5–8.1)

## 2018-02-23 LAB — GLUCOSE, CAPILLARY
Glucose-Capillary: 227 mg/dL — ABNORMAL HIGH (ref 70–99)
Glucose-Capillary: 156 mg/dL — ABNORMAL HIGH (ref 70–99)
Glucose-Capillary: 259 mg/dL — ABNORMAL HIGH (ref 70–99)
Glucose-Capillary: 144 mg/dL — ABNORMAL HIGH (ref 70–99)

## 2018-02-23 LAB — CBC
HCT: 33.6 % — ABNORMAL LOW (ref 39.0–52.0)
Hemoglobin: 10.8 g/dL — ABNORMAL LOW (ref 13.0–17.0)
MCH: 29.5 pg (ref 26.0–34.0)
MCHC: 32.1 g/dL (ref 30.0–36.0)
MCV: 91.8 fL (ref 80.0–100.0)
NRBC: 0 % (ref 0.0–0.2)
Platelets: 233 10*3/uL (ref 150–400)
RBC: 3.66 MIL/uL — ABNORMAL LOW (ref 4.22–5.81)
RDW: 14.6 % (ref 11.5–15.5)
WBC: 7.9 10*3/uL (ref 4.0–10.5)

## 2018-02-23 LAB — HEPARIN LEVEL (UNFRACTIONATED): Heparin Unfractionated: 0.6 IU/mL (ref 0.30–0.70)

## 2018-02-23 SURGERY — LEFT HEART CATH AND CORS/GRAFTS ANGIOGRAPHY
Anesthesia: LOCAL

## 2018-02-23 MED ORDER — MIDAZOLAM HCL 2 MG/2ML IJ SOLN
INTRAMUSCULAR | Status: AC
  Filled 2018-02-23: qty 2

## 2018-02-23 MED ORDER — LIDOCAINE HCL (PF) 1 % IJ SOLN
INTRAMUSCULAR | Status: AC
  Filled 2018-02-23: qty 30

## 2018-02-23 MED ORDER — SODIUM CHLORIDE 0.9% FLUSH
3.0000 mL | INTRAVENOUS | Status: DC | PRN
  Administered 2018-02-24: 3 mL via INTRAVENOUS
  Filled 2018-02-23: qty 3

## 2018-02-23 MED ORDER — HEPARIN (PORCINE) IN NACL 1000-0.9 UT/500ML-% IV SOLN
INTRAVENOUS | Status: DC | PRN
  Administered 2018-02-23 (×2): 500 mL

## 2018-02-23 MED ORDER — FENTANYL CITRATE (PF) 100 MCG/2ML IJ SOLN
INTRAMUSCULAR | Status: DC | PRN
  Administered 2018-02-23: 25 ug via INTRAVENOUS

## 2018-02-23 MED ORDER — LIDOCAINE HCL (PF) 1 % IJ SOLN
INTRAMUSCULAR | Status: DC | PRN
  Administered 2018-02-23: 20 mL

## 2018-02-23 MED ORDER — HEPARIN SODIUM (PORCINE) 1000 UNIT/ML IJ SOLN
INTRAMUSCULAR | Status: AC
  Filled 2018-02-23: qty 1

## 2018-02-23 MED ORDER — SODIUM CHLORIDE 0.9% FLUSH
3.0000 mL | Freq: Two times a day (BID) | INTRAVENOUS | Status: DC
  Administered 2018-02-23 – 2018-02-25 (×4): 3 mL via INTRAVENOUS

## 2018-02-23 MED ORDER — MIDAZOLAM HCL 2 MG/2ML IJ SOLN
INTRAMUSCULAR | Status: DC | PRN
  Administered 2018-02-23: 1 mg via INTRAVENOUS

## 2018-02-23 MED ORDER — HEPARIN (PORCINE) IN NACL 1000-0.9 UT/500ML-% IV SOLN
INTRAVENOUS | Status: AC
  Filled 2018-02-23: qty 1000

## 2018-02-23 MED ORDER — VERAPAMIL HCL 2.5 MG/ML IV SOLN
INTRAVENOUS | Status: AC
  Filled 2018-02-23: qty 2

## 2018-02-23 MED ORDER — METOPROLOL TARTRATE 25 MG PO TABS
25.0000 mg | ORAL_TABLET | Freq: Two times a day (BID) | ORAL | Status: DC
  Administered 2018-02-23 – 2018-02-24 (×2): 25 mg via ORAL
  Filled 2018-02-23 (×2): qty 1

## 2018-02-23 MED ORDER — SODIUM CHLORIDE 0.9 % IV SOLN: 250.0000 mL | INTRAVENOUS | Status: DC | PRN

## 2018-02-23 MED ORDER — ONDANSETRON HCL 4 MG/2ML IJ SOLN: 4.0000 mg | Freq: Four times a day (QID) | INTRAMUSCULAR | Status: DC | PRN

## 2018-02-23 MED ORDER — INSULIN GLARGINE 100 UNIT/ML ~~LOC~~ SOLN
16.0000 [IU] | Freq: Every day | SUBCUTANEOUS | Status: DC
  Administered 2018-02-23 – 2018-02-24 (×2): 16 [IU] via SUBCUTANEOUS
  Filled 2018-02-23 (×3): qty 0.16

## 2018-02-23 MED ORDER — IOHEXOL 350 MG/ML SOLN
INTRAVENOUS | Status: DC | PRN
  Administered 2018-02-23: 80 mL via INTRA_ARTERIAL

## 2018-02-23 MED ORDER — FENTANYL CITRATE (PF) 100 MCG/2ML IJ SOLN
INTRAMUSCULAR | Status: AC
  Filled 2018-02-23: qty 2

## 2018-02-23 SURGICAL SUPPLY — 9 items
CATH INFINITI 5FR MULTPACK ANG (CATHETERS) ×1 IMPLANT
CLOSURE MYNX CONTROL 5F (Vascular Products) ×1 IMPLANT
KIT HEART LEFT (KITS) ×2 IMPLANT
PACK CARDIAC CATHETERIZATION (CUSTOM PROCEDURE TRAY) ×2 IMPLANT
SHEATH PINNACLE 5F 10CM (SHEATH) ×1 IMPLANT
SHEATH PROBE COVER 6X72 (BAG) ×1 IMPLANT
TRANSDUCER W/STOPCOCK (MISCELLANEOUS) ×2 IMPLANT
TUBING CIL FLEX 10 FLL-RA (TUBING) ×2 IMPLANT
WIRE EMERALD 3MM-J .035X150CM (WIRE) ×1 IMPLANT

## 2018-02-23 NOTE — Progress Notes (Signed)
OT Cancellation Note  Patient Details Name: Philip [name]. MRN: [id number] DOB: 1950-10-19   Cancelled Treatment:    Reason Eval/Treat Not Completed: Patient at procedure or test/ unavailable. Pt in OR for heart cath. Will check back next appropriate/available time  Galen Manila 02/23/2018, 9:23 AM

## 2018-02-23 NOTE — Progress Notes (Signed)
   Subjective: Philip [name] was doing well today, we saw him post procedure and he feels like he tolerated it well. Denies any pain in the area. He reported that he slept well, has not been having any abdominal pain, nausea or vomiting. He appears comfortable. We discussed the plan for today and he is in agreement.   Objective:  Vital signs in last 24 hours: Vitals:   02/22/18 1922 02/22/18 2133 02/22/18 2143 02/23/18 0509  BP:   129/73 (!) 122/59  Pulse:   82 73  Resp:    15  Temp: 97.9 F (36.6 C) 98.3 F (36.8 C)  98.4 F (36.9 C)  TempSrc: Oral Oral  Oral  SpO2:    96%  Weight:      Height:        General: Elderly male, NAD, resting comfortably Cardiac: RRR, no m/r/g Pulmonary: CTABL, no wheezing Abdomen: Soft, non-tender, +BS   Assessment/Plan:  Principal Problem:   NSTEMI (non-ST elevated myocardial infarction) (HCC) Active Problems:   Shock (HCC)   Severe sepsis (HCC)   Acute respiratory failure with hypoxia (HCC)   Sepsis (HCC)   Hypotension   Hypoxemia   Sinus tachycardia This is a 68 year old male with a history of CAD s/p CABG, MCA CVA, DMII, HTN, andCKD who presented with a 1 day history of n/v, diarrhea, confusion and lethargy. Found to be hypotensive with a SBP of 70 and hypoglycemic with a CBG of 69. He was found to be febrile to 101, and hypotensive. He was fluid resuscitated however SBPs still remained low. Started on zosyn and azithromycin.  Severe hypotension 2/2emesis: Shock, likely septic 2/2 unknown source: -Blood pressures have been stable, around 110s/50s. He has remained afebrile with no leukocytosis. He is back to his baseline mental status, has a history of expressive aphasia. Today is his last day of antibiotics. Overall his shock has improved, unclear what the source was.  -Finish cefdinir and doxycycline to complete 7 days of antibiotics (2/11-2/17), appreciate pharmacy assisting with this -Tylenol PRN for fevers -Imodium for  diarrhea -PT/OT > recommends SNF -CSW assisting with SNF placement -Duonebs PRN -Patient is stable from this stand point  AKI: -Resolved.  -Continue to followBMP  Hypertension:  -Patient is on metoprolol and amlodipine at home. He was admitted for septic shock and he was hypotensive. His blood pressures are still on the lower side so we will continue to hold these medications for now.  -Continue to monitor  Atrial flutter w/ RVR: Elevated troponin: H/o CAD s/p CABG: -He had an episode of atrial flutter on 12/12 with ventricular rates in the 140s, he continued to be asymptomatic. Troponin peaked at 6.95. Echocardiogram on 2/9 showed preserved EF with akinesis of the apical lateral., Lateral, anterior and septal left ventricular segments. He has been having intermittent episodes of SVT and atrial flutter.  -Cardiology following, appreciate recommendations -S/p cardiac catheterization today, follow up results -Continue ASA and Plavix, follow up cardiology recommendations if any changes need to be made to this  Hyperlipidemia: -Continue Lipitor 80 mg daily  Diabetes: -On insulin and metformin at home.  -ContinueSSI -Increase Lantus to 16 units daily -Frequent CBGs  FEN:No fluids, replete lytes prn,heart healthy VTE ppx: Lovenox  Code Status: FULL  Dispo: Anticipated discharge in approximately 1-2 day(s).   Claudean Severance, MD 02/23/2018, 6:32 AM Pager: [phone]

## 2018-02-23 NOTE — Progress Notes (Addendum)
Progress Note  Patient Name: Philip [name]. Date of Encounter: 02/23/2018  Primary Cardiologist: Dr. Marny Lowenstein, [facility]  Subjective   Post-cath via right groin with mynx closure. No complaints  Inpatient Medications    Scheduled Meds: . aspirin EC  81 mg Oral Daily  . atorvastatin  80 mg Oral q1800  . cefdinir  600 mg Oral Daily  . citalopram  20 mg Oral Daily  . clopidogrel  75 mg Oral Daily  . doxycycline  100 mg Oral Q12H  . insulin aspart  0-15 Units Subcutaneous TID WC  . insulin aspart  0-5 Units Subcutaneous QHS  . insulin aspart  6 Units Subcutaneous TID WC  . insulin glargine  16 Units Subcutaneous QHS  . mouth rinse  15 mL Mouth Rinse BID  . metoprolol tartrate  12.5 mg Oral BID  . sodium chloride flush  3 mL Intravenous Q12H   Continuous Infusions: . sodium chloride     PRN Meds: sodium chloride, acetaminophen **OR** acetaminophen, LORazepam, ondansetron (ZOFRAN) IV, sodium chloride flush   Vital Signs    Vitals:   02/23/18 1125 02/23/18 1130 02/23/18 1135 02/23/18 1222  BP:   (!) 107/51 137/61  Pulse: 68 66 68 77  Resp:    20  Temp:    98.1 F (36.7 C)  TempSrc:    Oral  SpO2: (!) 83% 91% (!) 84% 95%  Weight:      Height:        Intake/Output Summary (Last 24 hours) at 02/23/2018 1427 Last data filed at 02/23/2018 0829 Gross per 24 hour  Intake 360 ml  Output 1450 ml  Net -1090 ml   Last 3 Weights 02/20/2018 02/19/2018 02/18/2018  Weight (lbs) 172 lb 13.5 oz 209 lb 10.5 oz 167 lb 12.3 oz  Weight (kg) 78.4 kg 95.1 kg 76.1 kg      Telemetry    sinus - Personally Reviewed  ECG    No new tracings - Personally Reviewed  Physical Exam   GEN: No acute distress, resting flat post cath Neck: No JVD Cardiac: RRR, no murmurs, rubs, or gallops.  Respiratory: Clear to auscultation bilaterally. GI: Soft, nontender, non-distended  MS: No edema; No deformity. Right femoral with dressing, mynx closure Neuro:  Nonfocal  Psych: Normal affect   Labs     Chemistry Recent Labs  Lab 02/21/18 0353 02/22/18 0500 02/23/18 0422  NA 139 139 137  K 3.3* 4.0 4.0  CL 105 105 103  CO2 26 26 26   GLUCOSE 171* 90 243*  BUN 14 13 13   CREATININE 0.91 0.98 0.99  CALCIUM 8.0* 8.3* 8.5*  PROT 5.6* 5.8* 5.6*  ALBUMIN 2.3* 2.3* 2.5*  AST 19 25 26   ALT 27 32 37  ALKPHOS 41 42 48  BILITOT 0.8 0.7 1.0  GFRNONAA >60 >60 >60  GFRAA >60 >60 >60  ANIONGAP 8 8 8      Hematology Recent Labs  Lab 02/21/18 0353 02/22/18 0500 02/23/18 0422  WBC 6.8 6.3 7.9  RBC 3.69* 3.61* 3.66*  HGB 10.9* 10.8* 10.8*  HCT 33.1* 33.0* 33.6*  MCV 89.7 91.4 91.8  MCH 29.5 29.9 29.5  MCHC 32.9 32.7 32.1  RDW 13.9 14.4 14.6  PLT 208 215 233    Cardiac Enzymes Recent Labs  Lab 02/16/18 1529 02/16/18 1915 02/17/18 0013 02/18/18 0914  TROPONINI 2.38* 5.24* 6.95* 2.50*   No results for input(s): TROPIPOC in the last 168 hours.   BNPNo results for input(s): BNP, PROBNP  in the last 168 hours.   DDimer No results for input(s): DDIMER in the last 168 hours.   Radiology    No results found.  Cardiac Studies   Heart cath - results pending  2D Echo 02/15/18 IMPRESSIONS  1. The left ventricle has low normal systolic function of 50-55%.There is akinesis of the apical inferolateral, inferior, lateral, anterior and septal left ventricular segments. 2. The right ventricle has normal systolic function. The cavity was normal. There is no increase in right ventricular wall thickness. 3. The mitral valve is normal in structure. There is mild mitral annular calcification present. 4. The tricuspid valve is normal in structure. 5. The aortic valve has an indeterminant number of cusps There is moderate thickening and mild calcification of the aortic valve, with moderately decreased cusp excursion. 6. The pulmonic valve was normal in structure.  Patient Profile     68 y.o. male w/ h/o CAD, followed at [facility], s/p CABG in 2015, post operative atrial fibrillation,  HTN, HLD, Diabetes, CKD and prior CVAw/ subsequent expressive dysphasia, admitted for septic shock of unknown source. Cardiology was consulted for elevated troponin. Also noted to haveintermittedNSVT on tele as well as brief run of atrial flutter w/ RVR 02/18/18.  Assessment & Plan    1. Septic shock of unknown source - ABX have been D/C'ed - per primary   2. Elevated troponin, hx of CAD s/p CABG 2015 - troponin 0.98 --> 2.38 --> 5.24 --> 6.95 --> 2.50 - Heart cath today, films to be reviewed by Dr. Tresa Endo - pt on ASA, plavix, statin, and BB   3. Atrial flutter with RVR, NSVT - per Dr. Di Kindle note, pt had self-limiting atrial flutter and NSVT on telemetry - hx of CVA - pt needs long-term anticoagulation, but there is a question of compliance and baseline cognitive status - PT currently recommending SNF placement - will discuss with restarting heparin with attending, pt on ASA and plavix   4. AKI - follow creatinine following heart cath - sCr 0.99 today   5. Hyperlipidemia - repeat lipids in AM - on statin     For questions or updates, please contact [facility] Please consult [email or web address] for contact info under       Signed, Marcelino Duster, PA  02/23/2018, 2:27 PM     Patient seen and examined. Agree with assessment and plan. Pt now back in room. I tried to review cines but yet unavailable; will review in cath lab. Agree with initial plan for more aggressive medical management. BP stable; EF 50 - 55% on echo; continue BB and titrate and BP and HR allow; may be able to add low dose nitrates initially and possible cardizem or ranexa if additional therapy is necessary.    Lennette Bihari, MD, [facility] 02/23/2018 4:14 PM

## 2018-02-23 NOTE — Progress Notes (Signed)
Internal Medicine Attending:   I saw and examined the patient. I reviewed the resident's note and I agree with the resident's findings and plan as documented in the resident's note.  Patient states that he feels well today and denies any new complaints.  He is status post cardiac catheterization but denies any pain at the catheterization site.  Patient is initially made to the hospital with likely septic shock from unknown source.  Patient completed his course of antibiotics today.  Will DC his cefdinir and doxycycline in a.m.  Patient with no leukocytosis or fevers currently.  PT/OT recommending SNF placement.  Patient was noted to have an AKI on admission which has since resolved.  Patient's hospital course was complicated by troponin elevation up to 6.95 as well as an episode of a flutter.  This is likely secondary to demand ischemia.  Heparin drip is now been DC'd.  Cardiology follow-up and recommendations appreciated.  Patient is status post cardiac catheterization today.  We will follow-up official results.  Continue with aspirin and Plavix as well as high intensity statin for now.  No further work-up at this time.

## 2018-02-23 NOTE — Progress Notes (Signed)
CSW confirmed that [name] will be able to accept patient if ready tomorrow.   Osborne Casco Red Mandt LCSW [phone]

## 2018-02-23 NOTE — Interval H&P Note (Signed)
History and Physical Interval Note:  02/23/2018 9:17 AM  Philip R Danis Jr.  has presented today for surgery, with the diagnosis of Elevate trop  The various methods of treatment have been discussed with the patient and family. After consideration of risks, benefits and other options for treatment, the patient has consented to  Procedure(s): LEFT HEART CATH AND CORS/GRAFTS ANGIOGRAPHY (N/A) as a surgical intervention .  The patient's history has been reviewed, patient examined, no change in status, stable for surgery.  I have reviewed the patient's chart and labs.  Questions were answered to the patient's satisfaction.     Tonny Bollman

## 2018-02-24 ENCOUNTER — Encounter (HOSPITAL_COMMUNITY): Payer: Self-pay | Admitting: Cardiovascular Disease

## 2018-02-24 LAB — COMPREHENSIVE METABOLIC PANEL
ALT: 34 U/L (ref 0–44)
AST: 20 U/L (ref 15–41)
Albumin: 2.5 g/dL — ABNORMAL LOW (ref 3.5–5.0)
Alkaline Phosphatase: 48 U/L (ref 38–126)
Anion gap: 10 (ref 5–15)
BUN: 12 mg/dL (ref 8–23)
CO2: 25 mmol/L (ref 22–32)
Calcium: 8.8 mg/dL — ABNORMAL LOW (ref 8.9–10.3)
Chloride: 103 mmol/L (ref 98–111)
Creatinine, Ser: 0.99 mg/dL (ref 0.61–1.24)
GFR calc Af Amer: >60 mL/min (ref >60)
GFR calc non Af Amer: >60 mL/min (ref >60)
Glucose, Bld: 165 mg/dL — ABNORMAL HIGH (ref 70–99)
Potassium: 3.9 mmol/L (ref 3.5–5.1)
Sodium: 138 mmol/L (ref 135–145)
TOTAL PROTEIN: 6.1 g/dL — AB (ref 6.5–8.1)
Total Bilirubin: 0.7 mg/dL (ref 0.3–1.2)

## 2018-02-24 LAB — CBC
HEMATOCRIT: 32.8 % — AB (ref 39.0–52.0)
Hemoglobin: 10.6 g/dL — ABNORMAL LOW (ref 13.0–17.0)
MCH: 29.9 pg (ref 26.0–34.0)
MCHC: 32.3 g/dL (ref 30.0–36.0)
MCV: 92.4 fL (ref 80.0–100.0)
Platelets: 235 10*3/uL (ref 150–400)
RBC: 3.55 MIL/uL — ABNORMAL LOW (ref 4.22–5.81)
RDW: 14.8 % (ref 11.5–15.5)
WBC: 6.9 10*3/uL (ref 4.0–10.5)
nRBC: 0 % (ref 0.0–0.2)

## 2018-02-24 LAB — GLUCOSE, CAPILLARY
Glucose-Capillary: 154 mg/dL — ABNORMAL HIGH (ref 70–99)
Glucose-Capillary: 128 mg/dL — ABNORMAL HIGH (ref 70–99)
Glucose-Capillary: 138 mg/dL — ABNORMAL HIGH (ref 70–99)
Glucose-Capillary: 165 mg/dL — ABNORMAL HIGH (ref 70–99)

## 2018-02-24 MED ORDER — ISOSORBIDE MONONITRATE ER 30 MG PO TB24
30.0000 mg | ORAL_TABLET | Freq: Every day | ORAL | Status: DC
  Administered 2018-02-24 – 2018-02-25 (×2): 30 mg via ORAL
  Filled 2018-02-24 (×2): qty 1

## 2018-02-24 MED ORDER — METOPROLOL TARTRATE 25 MG PO TABS
37.5000 mg | ORAL_TABLET | Freq: Two times a day (BID) | ORAL | Status: DC
  Administered 2018-02-24 – 2018-02-25 (×2): 37.5 mg via ORAL
  Filled 2018-02-24 (×2): qty 1

## 2018-02-24 MED FILL — Verapamil HCl IV Soln 2.5 MG/ML: INTRAVENOUS | Qty: 2 | Status: AC

## 2018-02-24 NOTE — Clinical Social Work Placement (Signed)
   CLINICAL SOCIAL WORK PLACEMENT  NOTE  Date:  02/24/2018  Patient Details  Name: Philip [name]. MRN: [id number] Date of Birth: [birth year]  Clinical Social Work is seeking post-discharge placement for this patient at the Skilled  Nursing Facility level of care (*CSW will initial, date and re-position this form in  chart as items are completed):  Yes   Patient/family provided with [facility] Clinical Social Work Department's list of facilities offering this level of care within the geographic area requested by the patient (or if unable, by the patient's family).  Yes   Patient/family informed of their freedom to choose among providers that offer the needed level of care, that participate in Medicare, Medicaid or managed care program needed by the patient, have an available bed and are willing to accept the patient.  Yes   Patient/family informed of [facility]'s ownership interest in [facility] and [facility], as well as of the fact that they are under no obligation to receive care at these facilities.  PASRR submitted to EDS on 02/20/18     PASRR number received on 02/20/18     Existing PASRR number confirmed on       FL2 transmitted to all facilities in geographic area requested by pt/family on 02/20/18     FL2 transmitted to all facilities within larger geographic area on       Patient informed that his/her managed care company has contracts with or will negotiate with certain facilities, including the following:        Yes   Patient/family informed of bed offers received.  Patient chooses bed at [facility]     Physician recommends and patient chooses bed at      Patient to be transferred to [facility] on 02/24/18.  Patient to be transferred to facility by [facility]     Patient family notified on 02/24/18 of transfer.  Name of family member notified:  Darel Hong     PHYSICIAN Please prepare priority discharge summary, including medications     Additional  Comment:    _______________________________________________ Mearl Latin, LCSW 02/24/2018, 2:37 PM

## 2018-02-24 NOTE — Progress Notes (Signed)
Occupational Therapy Treatment Patient Details Name: Philip [name]. MRN: [id number] DOB: May 24, 1950 Today's Date: 02/24/2018    History of present illness Patient is a 68 year old male admitted with nausea, vomiting, diarreah, increased lethargy and confusion. Found to have leukocytosis, septic shock, elevated troponins due to demand ischemia, s/p L heart cath on 02/23/18.  PMH to include: HTN, CKD, DM, CAD s/p CABG, CVA with L side weakness.       OT comments  Pt making progress with functional goals, continues to demo cognitive impairments.decreased safety awareness during ADLs and ADL mobility  Follow Up Recommendations  SNF;Supervision/Assistance - 24 hour    Equipment Recommendations  Other (comment)(TBD at SNF)    Recommendations for Other Services      Precautions / Restrictions Precautions Precautions: Fall Restrictions Weight Bearing Restrictions: No       Mobility Bed Mobility Overal bed mobility: Modified Independent             General bed mobility comments: pt in recliner upon arrival  Transfers Overall transfer level: Needs assistance Equipment used: Rolling walker (2 wheeled) Transfers: Sit to/from Stand Sit to Stand: Min guard         General transfer comment: Cues needed for safety with transfers, hand placement    Balance Overall balance assessment: Mild deficits observed, not formally tested                                         ADL either performed or assessed with clinical judgement   ADL Overall ADL's : Needs assistance/impaired     Grooming: Wash/dry hands;Wash/dry face;Standing;Min guard;Brushing hair   Upper Body Bathing: Supervision/ safety;Set up;Sitting Upper Body Bathing Details (indicate cue type and reason): simulated Lower Body Bathing: Minimal assistance;Sit to/from stand;Cueing for safety Lower Body Bathing Details (indicate cue type and reason): simulated Upper Body Dressing :  Supervision/safety;Set up;Sitting   Lower Body Dressing: Minimal assistance Lower Body Dressing Details (indicate cue type and reason): to don socks Toilet Transfer: Min guard;Cueing for safety;Cueing for sequencing;Ambulation;Regular Toilet;Grab bars   Toileting- Clothing Manipulation and Hygiene: Sit to/from stand;Cueing for sequencing;Cueing for safety;Min guard       Functional mobility during ADLs: Min guard;Cueing for safety;Cueing for sequencing;Rolling walker General ADL Comments: pt with cognitive impairments and requires multiodal cues, instructions to be repeated and cues for safety     Vision Patient Visual Report: No change from baseline     Perception     Praxis      Cognition Arousal/Alertness: Awake/alert   Overall Cognitive Status: No family/caregiver present to determine baseline cognitive functioning Area of Impairment: Safety/judgement;Orientation;Attention;Memory;Awareness;Problem solving;Following commands                 Orientation Level: Disoriented to;Place;Time;Situation   Memory: Decreased short-term memory Following Commands: Follows one step commands inconsistently Safety/Judgement: Decreased awareness of safety   Problem Solving: Slow processing;Difficulty sequencing;Requires verbal cues General Comments: patient unable to follow directions consistently or answer questions consistently. No family present        Exercises General Exercises - Lower Extremity Ankle Circles/Pumps: AROM;Supine;Both;10 reps Heel Slides: AROM;Supine;Both;10 reps Hip ABduction/ADduction: AROM;Supine;Both;10 reps   Shoulder Instructions       General Comments      Pertinent Vitals/ Pain       Pain Assessment: No/denies pain  Home Living  Prior Functioning/Environment              Frequency  Min 2X/week        Progress Toward Goals  OT Goals(current goals can now be found in the  care plan section)  Progress towards OT goals: Progressing toward goals  Acute Rehab OT Goals Patient Stated Goal: none stated  Plan Discharge plan remains appropriate    Co-evaluation                 AM-PAC OT "6 Clicks" Daily Activity     Outcome Measure   Help from another person eating meals?: None Help from another person taking care of personal grooming?: A Little Help from another person toileting, which includes using toliet, bedpan, or urinal?: A Lot Help from another person bathing (including washing, rinsing, drying)?: A Lot Help from another person to put on and taking off regular upper body clothing?: A Little Help from another person to put on and taking off regular lower body clothing?: A Lot 6 Click Score: 16    End of Session Equipment Utilized During Treatment: Gait belt;Rolling walker  OT Visit Diagnosis: Unsteadiness on feet (R26.81);Other abnormalities of gait and mobility (R26.89);Muscle weakness (generalized) (M62.81);Other symptoms and signs involving cognitive function;Cognitive communication deficit (R41.841)   Activity Tolerance Patient tolerated treatment well   Patient Left with call bell/phone within reach;in chair;with chair alarm set   Nurse Communication          Time: [phone] OT Time Calculation (min): 28 min  Charges: OT General Charges $OT Visit: 1 Visit OT Treatments $Self Care/Home Management : 23-37 mins     Galen Manila 02/24/2018, 12:59 PM

## 2018-02-24 NOTE — Progress Notes (Signed)
Progress Note  Patient Name: Philip [name]. Date of Encounter: 02/24/2018  Primary Cardiologist: Dr. Marny Lowenstein, [facility]  Subjective   No chest pain  Inpatient Medications    Scheduled Meds: . aspirin EC  81 mg Oral Daily  . atorvastatin  80 mg Oral q1800  . citalopram  20 mg Oral Daily  . clopidogrel  75 mg Oral Daily  . insulin aspart  0-15 Units Subcutaneous TID WC  . insulin aspart  0-5 Units Subcutaneous QHS  . insulin aspart  6 Units Subcutaneous TID WC  . insulin glargine  16 Units Subcutaneous QHS  . mouth rinse  15 mL Mouth Rinse BID  . metoprolol tartrate  25 mg Oral BID  . sodium chloride flush  3 mL Intravenous Q12H   Continuous Infusions: . sodium chloride     PRN Meds: sodium chloride, acetaminophen **OR** acetaminophen, LORazepam, ondansetron (ZOFRAN) IV, sodium chloride flush   Vital Signs    Vitals:   02/23/18 1352 02/23/18 1452 02/23/18 1726 02/24/18 0513  BP: 110/64 (!) 100/54 127/65   Pulse: 67 65 71   Resp: (!) 22 (!) 21 17   Temp:    98.2 F (36.8 C)  TempSrc:    Oral  SpO2: 98% 98% 95%   Weight:      Height:       No intake or output data in the 24 hours ending 02/24/18 1528 Last 3 Weights 02/20/2018 02/19/2018 02/18/2018  Weight (lbs) 172 lb 13.5 oz 209 lb 10.5 oz 167 lb 12.3 oz  Weight (kg) 78.4 kg 95.1 kg 76.1 kg      Telemetry    Sinus - Personally Reviewed  ECG    NSR at 82 - Personally Reviewed  Physical Exam   BP 127/65   Pulse 71   Temp 98.2 F (36.8 C) (Oral)   Resp 17   Ht 5\' 8"  (1.727 m) Comment: will verify  Wt 78.4 kg   SpO2 95%   BMI 26.28 kg/m  General: Alert, oriented, no distress.  Skin: normal turgor, no rashes, warm and dry HEENT: Normocephalic, atraumatic. Pupils equal round and reactive to light; sclera anicteric; extraocular muscles intact;  Nose without nasal septal hypertrophy Mouth/Parynx benign; Mallinpatti scale 3 Neck: No JVD, no carotid bruits; normal carotid upstroke Lungs: clear to  ausculatation and percussion; no wheezing or rales Chest wall: without tenderness to palpitation Heart: PMI not displaced, RRR, s1 s2 normal, 1/6 systolic murmur, no diastolic murmur, no rubs, gallops, thrills, or heaves Abdomen: soft, nontender; no hepatosplenomehaly, BS+; abdominal aorta nontender and not dilated by palpation. Back: no CVA tenderness Pulses 2+R groin Mynx site stable Musculoskeletal: full range of motion, normal strength, no joint deformities Extremities: no clubbing cyanosis or edema, Homan's sign negative  Neurologic: grossly nonfocal; Cranial nerves grossly wnl Psychologic: Normal mood and affect   Labs    Chemistry Recent Labs  Lab 02/22/18 0500 02/23/18 0422 02/24/18 0706  NA 139 137 138  K 4.0 4.0 3.9  CL 105 103 103  CO2 26 26 25   GLUCOSE 90 243* 165*  BUN 13 13 12   CREATININE 0.98 0.99 0.99  CALCIUM 8.3* 8.5* 8.8*  PROT 5.8* 5.6* 6.1*  ALBUMIN 2.3* 2.5* 2.5*  AST 25 26 20   ALT 32 37 34  ALKPHOS 42 48 48  BILITOT 0.7 1.0 0.7  GFRNONAA >60 >60 >60  GFRAA >60 >60 >60  ANIONGAP 8 8 10      Hematology Recent Labs  Lab 02/22/18  0500 02/23/18 0422 02/24/18 0706  WBC 6.3 7.9 6.9  RBC 3.61* 3.66* 3.55*  HGB 10.8* 10.8* 10.6*  HCT 33.0* 33.6* 32.8*  MCV 91.4 91.8 92.4  MCH 29.9 29.5 29.9  MCHC 32.7 32.1 32.3  RDW 14.4 14.6 14.8  PLT 215 233 235    Cardiac Enzymes Recent Labs  Lab 02/18/18 0914  TROPONINI 2.50*   No results for input(s): TROPIPOC in the last 168 hours.   BNPNo results for input(s): BNP, PROBNP in the last 168 hours.   DDimer No results for input(s): DDIMER in the last 168 hours.    Lipid Panel  No results found for: CHOL, TRIG, HDL, CHOLHDL, VLDL, LDLCALC, LDLDIRECT  Radiology    No results found.  Cardiac Studies   Heart cath 02/23/18:  Dist Graft lesion is 100% stenosed.  Prox RCA lesion is 50% stenosed.  Post Atrio lesion is 75% stenosed.  Prox LAD to Mid LAD lesion is 90% stenosed.  Mid LAD to  Dist LAD lesion is 80% stenosed.  Ost Ramus to Ramus lesion is 95% stenosed.  Prox Cx to Mid Cx lesion is 90% stenosed.   1.  Severe multivessel coronary artery disease with severe diffuse stenosis of the LAD, severe stenosis of the ramus intermedius branch, severe stenosis of the circumflex, and moderately severe stenosis of the distal RCA 2.  Status post aortocoronary bypass surgery with occlusion of the LIMA to LAD graft before the insertion in the LAD, wide patency of the saphenous vein graft to OM, and wide patency of the saphenous vein graft to PDA 3.  Severely elevated LVEDP  Recommendations: The patient had significant troponin elevation in the context of septic shock.  He has severe multivessel coronary disease with a diffuse disease pattern, heavy calcification, and no clear culprit lesion to account for his non-STEMI.  I suspect he had diffuse myocardial ischemia in the setting of sepsis.  Considering his cognitive issues, diffuse disease pattern, and poor functional status, it may be most appropriate to treat him medically as a first-line approach.   2D Echo 02/15/18 IMPRESSIONS  1. The left ventricle has low normal systolic function of 50-55%.There is akinesis of the apical inferolateral, inferior, lateral, anterior and septal left ventricular segments. 2. The right ventricle has normal systolic function. The cavity was normal. There is no increase in right ventricular wall thickness. 3. The mitral valve is normal in structure. There is mild mitral annular calcification present. 4. The tricuspid valve is normal in structure. 5. The aortic valve has an indeterminant number of cusps There is moderate thickening and mild calcification of the aortic valve, with moderately decreased cusp excursion. 6. The pulmonic valve was normal in structure.  Patient Profile     68 y.o. male w/ h/o CAD, [facility], s/p CABG in 2015, post operative atrial fibrillation, HTN, HLD,  Diabetes, CKD and prior CVAw/ subsequent expressive dysphasia, admitted for septic shock of unknown source. Cardiology was consulted for elevated troponin. Also noted to haveintermittedNSVT on tele as well as brief run of atrial flutter w/ RVR 02/18/18.  Assessment & Plan    1. NSTEMI in the setting of septic shock - heart cath yesterday with severe multivessel CAD with a diffuse disease pattern and no clear culprit lesion for his NSTEMI.  - in consultation with Dr. Tresa EndoKelly and Dr. Excell Seltzerooper, will treat medically - optimize home medications - regimen to include: ASA, plavix, lipitor, lopressor 25 mg BID - if he continues to have pain, consider  long-acting nitrate vs CCB   2. Paroxysmal atrial flutter with RVR - per Dr. Di Kindle note, pt had self-limiting atrial flutter and NSVT on telemetry - may be related to sepsis - hx of CVA - pt needs long-term anticoagulation, but there is a question of compliance and baseline cognitive status - PT currently recommending SNF placement - will discuss with attending, pt is on ASA and plavix for CAD - recommend 30 day monitor to evaluate burden   3. AKI - Creatinine normalized - 0.99   4. Hyperlipidemia - follow with primary cardiologist   [facility] will sign off.   Medication Recommendations:  As above Other recommendations (labs, testing, etc):  Repeat lipids, LDL goal less than 70 Follow up as an outpatient:  With primary cards at [facility]  For questions or updates, please contact [facility] Please consult [email or web address] for contact info under       Signed, Marcelino Duster, PA  02/24/2018, 3:28 PM     Patient seen and examined. Agree with assessment and plan. I have reviewed angios and discussed with patient. Agree with initial medical therapy mangement.  Will further titrate metoprolol to 37.5 mg bid and add imdur 30 mg.  Agree with ASA/ Plavix.  Repeat am fasting lipid,  If LDL> 70 on atorvastatin 80 mg;  add zetia 10  mg   Lennette Bihari, MD, [facility] 02/24/2018 5:43 PM

## 2018-02-24 NOTE — Progress Notes (Signed)
Internal Medicine Attending:   I saw and examined the patient. I reviewed the resident's note and I agree with the resident's findings and plan as documented in the resident's note.  Patient feels well today with no new complaints.  Patient is initially admitted to hospital with sepsis secondary to an unknown source.  He completed his course of antibiotics while inpatient.  PT/OT recommending SNF placement.  Patient is stable for DC to SNF once bed is available.  Of note, patient's hospital course was complicated by elevated troponins (up to 6.95) likely secondary to demand ischemia in the setting of septic shock.  Patient status post cardiac catheterization which showed three-vessel coronary artery disease with diffuse stenosis of the LAD, ramus, circumflex and distal RCA.  We will continue with medical management at this time.  Await final cardiac recommendations prior to DC to SNF.

## 2018-02-24 NOTE — Progress Notes (Signed)
Physical Therapy Treatment Patient Details Name: Philip [name] Hageman. MRN: [id number] DOB: February 05, 1950 Today's Date: 02/24/2018    History of Present Illness Patient is a 68 year old male admitted with nausea, vomiting, diarreah, increased lethargy and confusion. Found to have leukocytosis, septic shock, elevated troponins due to demand ischemia, s/p L heart cath on 02/23/18.  PMH to include: HTN, CKD, DM, CAD s/p CABG, CVA with L side weakness.        PT Comments    Pt presented in bed alert and awake. Pt oriented only to his person. Pt denied pain and said he "felt pretty good." Pt tolerated therex in supine well. Pt BP taken at start of therapy 129/70. Pt became dizzy when attempting to rise for gait activity and was reseated w/ BP taken: 126/60. Pt stated he felt good and was returned to stand with BP taken: 102/53. Pt stated he was not dizzy and gait training performed for ~40 ft before fatigue set in. Pt was seated in chair following behind with BP taken: 127/72. Pt required constant vc/tc for posture and step length. Pt unable to follow single commands for gait safety and orientation. Pt limited d/t cognitive impairments and impulsivity in regards to walker safety. Based on current impairments and limitations, pt remains on track for current SNF discharge plan. Progress next visit with increased gait as tolerated and seated to standing exercise.   Follow Up Recommendations  SNF     Equipment Recommendations  Rolling walker with 5" wheels    Recommendations for Other Services       Precautions / Restrictions Precautions Precautions: Fall Restrictions Weight Bearing Restrictions: No    Mobility  Bed Mobility Overal bed mobility: Modified Independent             General bed mobility comments: use of bed rails  Transfers Overall transfer level: Needs assistance Equipment used: Rolling walker (2 wheeled) Transfers: Sit to/from Stand Sit to Stand: Min guard          General transfer comment: Cues needed for safety with transfers, hand placement  Ambulation/Gait Ambulation/Gait assistance: Min guard Gait Distance (Feet): 40 Feet Assistive device: Rolling walker (2 wheeled) Gait Pattern/deviations: Step-through pattern;Trunk flexed(Pt gaze fixed down and unable to follow command to look up and out. Pt proceeded to look all around the lobby area when walking instead of looking forward.)     General Gait Details: Patient slightly unsteady with ambulation. Pt stated he was tired and gait was terminated ~40 ft.    Stairs             Wheelchair Mobility    Modified Rankin (Stroke Patients Only)       Balance Overall balance assessment: Mild deficits observed, not formally tested                                          Cognition Arousal/Alertness: Awake/alert   Overall Cognitive Status: No family/caregiver present to determine baseline cognitive functioning Area of Impairment: Safety/judgement;Orientation;Attention;Memory;Awareness;Problem solving;Following commands                 Orientation Level: Disoriented to;Place;Time;Situation     Following Commands: Follows one step commands inconsistently Safety/Judgement: Decreased awareness of safety   Problem Solving: Slow processing;Difficulty sequencing;Requires verbal cues General Comments: patient unable to follow directions consistently or answer questions consistently. No family present  Exercises General Exercises - Lower Extremity Ankle Circles/Pumps: AROM;Supine;Both;10 reps Heel Slides: AROM;Supine;Both;10 reps Hip ABduction/ADduction: AROM;Supine;Both;10 reps    General Comments        Pertinent Vitals/Pain Pain Assessment: No/denies pain    Home Living                      Prior Function            PT Goals (current goals can now be found in the care plan section) Acute Rehab PT Goals Patient Stated Goal: none  stated PT Goal Formulation: Patient unable to participate in goal setting    Frequency    Min 2X/week      PT Plan Current plan remains appropriate    Co-evaluation              AM-PAC PT "6 Clicks" Mobility   Outcome Measure  Help needed turning from your back to your side while in a flat bed without using bedrails?: A Little Help needed moving from lying on your back to sitting on the side of a flat bed without using bedrails?: A Little Help needed moving to and from a bed to a chair (including a wheelchair)?: A Little Help needed standing up from a chair using your arms (e.g., wheelchair or bedside chair)?: A Little Help needed to walk in hospital room?: A Little Help needed climbing 3-5 steps with a railing? : A Lot 6 Click Score: 17    End of Session Equipment Utilized During Treatment: Gait belt Activity Tolerance: Patient tolerated treatment well Patient left: in chair;with call bell/phone within reach;with chair alarm set Nurse Communication: Mobility status       Time: 1610-9604 PT Time Calculation (min) (ACUTE ONLY): 29 min  Charges:  $Gait Training: 8-22 mins $Therapeutic Activity: 8-22 mins                     Margarita Mail, SPTA   Margarita Mail 02/24/2018, 11:04 AM

## 2018-02-24 NOTE — Progress Notes (Signed)
Subjective: Mr. Somerville was doing well today, no acute events overnight. He denies any chest pain, shortness of breath, abdominal pain, or other issues. He slept well. We discussed the results of the catheterization. We discussed that we will discharge him to the SNF today and he in agreement. All questions were answered.   Objective:  Vital signs in last 24 hours: Vitals:   02/23/18 1352 02/23/18 1452 02/23/18 1726 02/24/18 0513  BP: 110/64 (!) 100/54 127/65   Pulse: 67 65 71   Resp: (!) 22 (!) 21 17   Temp:    98.2 F (36.8 C)  TempSrc:    Oral  SpO2: 98% 98% 95%   Weight:      Height:        General: Frail appearing male, NAD, sitting comfortably in the chair Cardiac: RRR, no m/r/g Pulmonary: Lungs CTABL, no wheezing or rhonchi Abdomen: Soft, non-tender, +BS Extremity:No LE edema   Assessment/Plan:  Principal Problem:   NSTEMI (non-ST elevated myocardial infarction) (HCC) Active Problems:   Shock (HCC)   Severe sepsis (HCC)   Acute respiratory failure with hypoxia (HCC)   Sepsis (HCC)   Hypotension   Hypoxemia   Sinus tachycardia  This is a 68 year old male with a history of CAD s/p CABG, MCA CVA, DMII, HTN, andCKD who presented with a 1 day history of n/v, diarrhea, confusion and lethargy. Found to be hypotensive with a SBP of 70 and hypoglycemic with a CBG of 69. He was found to be febrile to 101, and hypotensive. He was fluid resuscitated however SBPs still remained low. Started on zosyn and azithromycin.  Severe hypotension 2/2emesis: Shock, likely septic 2/2 unknown source: -Blood pressures stable, he has remained afebrile with no leukocytosis.Completed his last day of antibiotics yesterday. Overall his shock has improved, unclear what the source was however did complete a 7 day course of antibiotics for pneumonia.  -Tylenol PRN for fevers -Imodium for diarrhea -PT/OT> recommends SNF -CSW assisting with SNF placement, bed available today -Duonebs  PRN -Discharge to SNF today  AKI: -Resolved. -Repeat BMP on hospital follow up  Hypertension:  -Patient is on metoprolol and amlodipine at home. He was admitted for septic shock and he was hypotensive. His blood pressures are still on the lower side so we will continue to hold these medications for now.  -Will hold these medications on discharge  Atrial flutter w/ RVR: Elevated troponin: H/o CAD s/p CABG: -He had an episode of atrial flutter on 12/12 with ventricular rates in the 140s, he continued to be asymptomatic. Troponin peaked at 6.95. Echocardiogram on 2/9 showed preserved EF with akinesis of the apical lateral., Lateral, anterior and septal left ventricular segments. He has been having intermittent episodes of SVT and atrial flutter.  -Cardiology following, appreciate recommendations -Cardiac catheterization showed three-vessel coronary artery disease, diffuse stenosis of LAD, ramus, circumflex, distal RCA.  Status post aortocoronary bypass occlusion.  Elevated left ventricular end-diastolic pressure.  Start to be a diffuse MI, no specific artery identified as cause of end STEMI.  They recommended medical management at this time. -Continue ASA and Plavix, follow up cardiology recommendations if any changes need to be made to this  Hyperlipidemia: -ContinueLipitor 80 mg daily  Diabetes mellitus: -On insulin and metformin at home.  -ContinueSSI -Continue Lantus to 16 units daily -Frequent CBGs -Resume home medications on discharge  FEN:No fluids, replete lytes prn,heart healthy VTE ppx: Lovenox  Code Status: FULL  Dispo: Anticipated discharge in approximately today.  Claudean Severance, MD 02/24/2018, 6:47 AM Pager: [phone]

## 2018-02-25 DIAGNOSIS — I471 Supraventricular tachycardia: Secondary | ICD-10-CM

## 2018-02-25 DIAGNOSIS — Z885 Allergy status to narcotic agent status: Secondary | ICD-10-CM

## 2018-02-25 DIAGNOSIS — I69354 Hemiplegia and hemiparesis following cerebral infarction affecting left non-dominant side: Secondary | ICD-10-CM

## 2018-02-25 DIAGNOSIS — I69320 Aphasia following cerebral infarction: Secondary | ICD-10-CM

## 2018-02-25 LAB — LIPID PANEL
CHOL/HDL RATIO: 3.1 ratio
Cholesterol: 106 mg/dL (ref 0–200)
HDL: 34 mg/dL — ABNORMAL LOW (ref >40)
LDL Cholesterol: 54 mg/dL (ref 0–99)
Triglycerides: 88 mg/dL (ref <150)
VLDL: 18 mg/dL (ref 0–40)

## 2018-02-25 LAB — GLUCOSE, CAPILLARY
Glucose-Capillary: 238 mg/dL — ABNORMAL HIGH (ref 70–99)
Glucose-Capillary: 201 mg/dL — ABNORMAL HIGH (ref 70–99)

## 2018-02-25 MED ORDER — METOPROLOL TARTRATE 37.5 MG PO TABS: 37.5000 mg | ORAL_TABLET | Freq: Two times a day (BID) | ORAL | 0 refills | Status: AC

## 2018-02-25 MED ORDER — ATORVASTATIN CALCIUM 80 MG PO TABS: 80.0000 mg | ORAL_TABLET | Freq: Every day | ORAL | 0 refills | Status: AC

## 2018-02-25 MED ORDER — ISOSORBIDE MONONITRATE ER 30 MG PO TB24: 30.0000 mg | ORAL_TABLET | Freq: Every day | ORAL | 0 refills | Status: AC

## 2018-02-25 NOTE — Discharge Instructions (Signed)
Jaythen R USAA.,   It has been a pleasure working with you and we are glad you're feeling better. You were hospitalized for sepsis caused by an infection, your blood pressure was very low and we treated you with antibiotics. It looks like you may have had a pneumonia. We strongly recommended that you go to a rehab, it would have been safer however you decided to return home. We have set up home health therapy to help you work on your strength.   You were also noted to have some heart damage, this was likely related to your low blood pressure.  Please STOP taking your amlodipine for now, please follow up with your PCP to see if this needs to be restarted.   Please continue to take your aspirin and plavix.  Please START taking Lipitor 80 mg daily Increase your Lopressor to 37.5 mg twice a day Please START taking Imdur 30 mg daily Please follow up with cardiology in 1-2 weeks  Follow up with your primary care provider in 1-2 weeks  If your symptoms worsen or you develop new symptoms, please seek medical help whether it is your primary care provider or emergency department.  If you have any questions about this hospitalization please call [phone].

## 2018-02-25 NOTE — Progress Notes (Signed)
Report called to [facility] at this time. Questions answered.

## 2018-02-25 NOTE — Progress Notes (Signed)
Nsg Discharge Note  Admit Date:  02/15/2018 Discharge date: 02/25/2018   Janan Ridge Sandhu [name]. to be D/C'd Skilled nursing facility - [facility] per MD order.  AVS completed. Patient/caregiver able to verbalize understanding.  Discharge Medication: Allergies as of 02/25/2018      Reactions   Codeine    GI upset      Medication List    STOP taking these medications   amLODipine 5 MG tablet Commonly known as:  NORVASC   esomeprazole 40 MG capsule Commonly known as:  NEXIUM   multivitamin with minerals Tabs tablet     TAKE these medications   aspirin EC 81 MG tablet Take 81 mg by mouth daily.   atorvastatin 80 MG tablet Commonly known as:  LIPITOR Take 1 tablet (80 mg total) by mouth daily at 6 PM.   calcium carbonate 500 MG chewable tablet Commonly known as:  TUMS - dosed in mg elemental calcium Chew 2 tablets by mouth as needed for indigestion or heartburn.   citalopram 20 MG tablet Commonly known as:  CELEXA Take 20 mg by mouth daily.   clopidogrel 75 MG tablet Commonly known as:  PLAVIX Take 75 mg by mouth daily.   insulin NPH Human 100 UNIT/ML injection Commonly known as:  HUMULIN N Inject 0.05 mLs (5 Units total) into the skin 2 (two) times daily before a meal. What changed:  how much to take   isosorbide mononitrate 30 MG 24 hr tablet Commonly known as:  IMDUR Take 1 tablet (30 mg total) by mouth daily. Start taking on:  February 26, 2018   metFORMIN 1000 MG tablet Commonly known as:  GLUCOPHAGE Take 1,000 mg by mouth 2 (two) times daily with a meal.   Metoprolol Tartrate 37.5 MG Tabs Take 37.5 mg by mouth 2 (two) times daily. What changed:    medication strength  how much to take   nitroGLYCERIN 0.4 MG/SPRAY spray Commonly known as:  NITROLINGUAL Place 1 spray under the tongue every 5 (five) minutes x 3 doses as needed for chest pain.       Discharge Assessment: Vitals:   02/25/18 1216 02/25/18 1525  BP:  108/61  Pulse:  66  Resp:  20   Temp: (!) 97.4 F (36.3 C) 97.8 F (36.6 C)  SpO2:  98%   Skin clean, dry and intact without evidence of skin break down, no evidence of skin tears noted. IV catheter discontinued intact. Site without signs and symptoms of complications - no redness or edema noted at insertion site, patient denies c/o pain - only slight tenderness at site.  Dressing with slight pressure applied.  D/c Instructions-Education: Discharge instructions given to patient/family with verbalized understanding. D/c education completed with patient/family including follow up instructions, medication list, d/c activities limitations if indicated, with other d/c instructions as indicated by MD - patient able to verbalize understanding, all questions fully answered. Patient instructed to return to ED, call 911, or call MD for any changes in condition.  Patient escorted via stretcher and [facility]tar ambulance services.  Lyndal Pulley, RN 02/25/2018 3:31 PM

## 2018-02-25 NOTE — Clinical Social Work Placement (Signed)
   CLINICAL SOCIAL WORK PLACEMENT  NOTE  Date:  02/25/2018  Patient Details  Name: [name] Mendez [name]. MRN: [id number] Date of Birth: Jan 27, 1950  Clinical Social Work is seeking post-discharge placement for this patient at the Skilled  Nursing Facility level of care (*CSW will initial, date and re-position this form in  chart as items are completed):  Yes   Patient/family provided with [facility] Clinical Social Work Department's list of facilities offering this level of care within the geographic area requested by the patient (or if unable, by the patient's family).  Yes   Patient/family informed of their freedom to choose among providers that offer the needed level of care, that participate in Medicare, Medicaid or managed care program needed by the patient, have an available bed and are willing to accept the patient.  Yes   Patient/family informed of [facility]'s ownership interest in [facility] and [facility], as well as of the fact that they are under no obligation to receive care at these facilities.  PASRR submitted to EDS on 02/20/18     PASRR number received on 02/20/18     Existing PASRR number confirmed on       FL2 transmitted to all facilities in geographic area requested by pt/family on 02/20/18     FL2 transmitted to all facilities within larger geographic area on       Patient informed that his/her managed care company has contracts with or will negotiate with certain facilities, including the following:        Yes   Patient/family informed of bed offers received.  Patient chooses bed at [facility]     Physician recommends and patient chooses bed at      Patient to be transferred to [facility] on 02/24/18.  Patient to be transferred to facility by [facility]     Patient family notified on 02/25/18 of transfer.  Name of family member notified:  Darel Hong     PHYSICIAN Please prepare priority discharge summary, including medications     Additional  Comment:    _______________________________________________ Mearl Latin, LCSW 02/25/2018, 2:00 PM

## 2018-02-25 NOTE — Progress Notes (Signed)
Subjective: Mr. Nuttall is doing well today, no acute events overnight.  Denies any new complaints today.  He reports that he is feeling well and wants to go home.  We discussed that PT and OT recommended SNF and we highly recommend him going to SNF to work on his strength and to make sure that all his new medications are started.  He reports understanding that he would be safer at a SNF however he thought about it last night and decided that had wants to go home.  Objective:  Vital signs in last 24 hours: Vitals:   02/24/18 1341 02/24/18 1741 02/24/18 2021 02/25/18 0521  BP: (!) 145/73 133/67    Pulse: 93 77    Resp: (!) 21 19    Temp:   98 F (36.7 C) 98.2 F (36.8 C)  TempSrc:    Oral  SpO2: 97% 98%    Weight:      Height:        General: Frail appearing male, no acute distress, resting comfortably Cardiac: RRR, no M/R/G Pulmonary: CTA BL, no wheezing or rhonchi Abdomen: Soft, non-tender, non-distended Extremity:No LE edema Neurology: Alert, expressive aphasia    Assessment/Plan:  Principal Problem:   NSTEMI (non-ST elevated myocardial infarction) (HCC) Active Problems:   Shock (HCC)   Severe sepsis (HCC)   Acute respiratory failure with hypoxia (HCC)   Sepsis (HCC)   Hypotension   Hypoxemia   Sinus tachycardia This is a 68 year old male with a history of CAD s/p CABG, MCA CVA, DMII, HTN, andCKD who presented with a 1 day history of n/v, diarrhea, confusion and lethargy. Found to be hypotensive with a SBP of 70 and hypoglycemic with a CBG of 69. He was found to be febrile to 101, and hypotensive. He was fluid resuscitated however SBPs still remained low. Started on zosyn and azithromycin.  Severe hypotension 2/2emesis: Shock, likely septic 2/2 unknown source: -Septic shock has resolved, remains afebrile with no new complaints.  -Tylenol PRN for fevers -Imodium for diarrhea -PT/OT> recommends SNF, patient reported that he did not want to go to SNF, he  understands the risks and would still like to go home.  -Home health PT/OT  AKI: -Resolved. -Repeat BMP on hospital follow up  Hypertension:  -Patient is on metoprolol and amlodipine at home. -Metoprolol 37.5 BID on discharge, hold amlodipine for now  Atrial flutter w/ RVR: Elevated troponin: H/o CAD s/p CABG: -He had an episode of atrial flutter on 12/12 with ventricular rates in the 140s, he continued to be asymptomatic. Troponin peaked at 6.95. Echocardiogram on 2/9 showed preserved EF with akinesis of the apical lateral., Lateral, anterior and septal left ventricular segments. He has been having intermittent episodes of SVT and atrial flutter.  -Cardiac catheterization showed three-vessel coronary artery disease, diffuse stenosis of LAD, ramus, circumflex, distal RCA.  Status post aortocoronary bypass occlusion.  Elevated left ventricular end-diastolic pressure.  Start to be a diffuse MI, no specific artery identified as cause of end STEMI.  They recommended medical management at this time. -Continue ASA and Plavix -Cardiology recommendations > continue ASA, plavix, and imdur, increase metoprolol to 37.5 BID  Hyperlipidemia: -ContinueLipitor 80 mg daily -Lipid panel today > choletserol 106, triglycerides 88, HDL 34, LDL 54.   Diabetes mellitus: -On insulin and metformin at home.  -Resume home medications on discharge  FEN:No fluids, replete lytes prn,heart healthy VTE ppx: Lovenox  Code Status: FULL  Dispo: Anticipated discharge in approximately today.   Gwyneth Revels,  Brantley Stage, MD 02/25/2018, 6:30 AM Pager: [phone]

## 2018-02-25 NOTE — Progress Notes (Signed)
Discussed with Dr. Gwyneth Revels that patient still has indwelling catheter. Instructed to remove catheter and ensure patient voids prior to discharge. If unable to void, perform bladder scan. Patient educated and verbalized understanding.

## 2018-02-25 NOTE — Progress Notes (Signed)
Internal Medicine Attending:   I saw and examined the patient. I reviewed the resident's note and I agree with the resident's findings and plan as documented in the resident's note.  Patient states that he feels well today and denies any new complaints.  Patient is initially admitted to the hospital with hypotension likely secondary to sepsis from an unknown source whose hospital course was complicated by an NSTEMI.  Septic shock is now resolved and patient is completed his antibiotic course.  Patient is status post cath for NSTEMI which showed diffuse coronary artery disease.  We will continue with medical management per cardiology (aspirin, Plavix and Imdur and increasing his metoprolol to 37.5 mg twice daily as well as a high intensity statin.  No further work-up at this time.  Patient stable for DC to SNF today.

## 2018-02-25 NOTE — Progress Notes (Signed)
[facility] will DC to: [facility] Anticipated DC date: 02/25/18 Family notified: Darel Hong Transport by: Sharin Mons   Per MD patient ready for DC to [facility]. RN, patient, patient's family, and facility notified of DC. Discharge Summary and FL2 sent to facility. RN to call report prior to discharge [phone]). DC packet on chart. Ambulance transport requested for patient.   CSW will sign off for now as social work intervention is no longer needed. Please consult [place] again if new needs arise.  Cristobal Goldmann, LCSW Clinical Social Worker [phone]

## 2018-02-25 NOTE — Progress Notes (Signed)
CSW and nurses spoke with ex-wife and his landlord who reported that they would not be able to take care of him and that they think he should go to the SNF. Spoke with patient who reported that he wants to go home and not go to the rehab. He was adamant about going home and that he did not need to go to the rehab. We discussed that it would be safest for him to go to the rehab for a short while but he was adamant that he did not need it. We discussed that he was very sick when he came in and he thought that he was fine at that time, he states that he is aware of this but that he is better now. He spoke with his ex-wife on the phone and got into a large argument with her, about not being able to return home.   We asked him what occurred at the hospital and he was unable to answer that. He was not able to answer what medical conditions were. He was able to answer his full name, DOB, and that he was in the hospital and what year it is. He reports that he can go up and down the stairs, would be able to take care of himself, reports that he can walk around on his on. We reported that our recommendations would be for him to go to the rehab. He reported that he was agreeable to going to the SNF.

## 2018-03-15 ENCOUNTER — Other Ambulatory Visit (HOSPITAL_COMMUNITY): Payer: Self-pay | Admitting: Internal Medicine

## 2018-03-17 ENCOUNTER — Other Ambulatory Visit (HOSPITAL_COMMUNITY): Payer: Self-pay | Admitting: Internal Medicine

## 2018-04-12 ENCOUNTER — Other Ambulatory Visit: Payer: Self-pay | Admitting: Internal Medicine

## 2018-04-17 ENCOUNTER — Inpatient Hospital Stay (HOSPITAL_COMMUNITY)
Admission: EM | Admit: 2018-04-17 | Discharge: 2018-04-21 | DRG: 684 | Disposition: A | Discharge status: Skilled Nursing Facility | Payer: Medicare Other | Attending: Internal Medicine | Admitting: Internal Medicine

## 2018-04-17 ENCOUNTER — Other Ambulatory Visit: Payer: Self-pay

## 2018-04-17 ENCOUNTER — Encounter (HOSPITAL_COMMUNITY): Payer: Self-pay | Admitting: Emergency Medicine

## 2018-04-17 DIAGNOSIS — K5900 Constipation, unspecified: Secondary | ICD-10-CM | POA: Diagnosis present

## 2018-04-17 DIAGNOSIS — N179 Acute kidney failure, unspecified: Principal | ICD-10-CM | POA: Diagnosis present

## 2018-04-17 DIAGNOSIS — K449 Diaphragmatic hernia without obstruction or gangrene: Secondary | ICD-10-CM | POA: Diagnosis present

## 2018-04-17 DIAGNOSIS — E1122 Type 2 diabetes mellitus with diabetic chronic kidney disease: Secondary | ICD-10-CM | POA: Diagnosis present

## 2018-04-17 DIAGNOSIS — Z951 Presence of aortocoronary bypass graft: Secondary | ICD-10-CM

## 2018-04-17 DIAGNOSIS — E860 Dehydration: Secondary | ICD-10-CM | POA: Diagnosis present

## 2018-04-17 DIAGNOSIS — I69320 Aphasia following cerebral infarction: Secondary | ICD-10-CM

## 2018-04-17 DIAGNOSIS — Z7982 Long term (current) use of aspirin: Secondary | ICD-10-CM

## 2018-04-17 DIAGNOSIS — I252 Old myocardial infarction: Secondary | ICD-10-CM

## 2018-04-17 DIAGNOSIS — Z7902 Long term (current) use of antithrombotics/antiplatelets: Secondary | ICD-10-CM

## 2018-04-17 DIAGNOSIS — Z794 Long term (current) use of insulin: Secondary | ICD-10-CM

## 2018-04-17 DIAGNOSIS — R112 Nausea with vomiting, unspecified: Secondary | ICD-10-CM | POA: Diagnosis present

## 2018-04-17 DIAGNOSIS — Z885 Allergy status to narcotic agent status: Secondary | ICD-10-CM

## 2018-04-17 DIAGNOSIS — I129 Hypertensive chronic kidney disease with stage 1 through stage 4 chronic kidney disease, or unspecified chronic kidney disease: Secondary | ICD-10-CM | POA: Diagnosis present

## 2018-04-17 DIAGNOSIS — E119 Type 2 diabetes mellitus without complications: Secondary | ICD-10-CM

## 2018-04-17 DIAGNOSIS — I25810 Atherosclerosis of coronary artery bypass graft(s) without angina pectoris: Secondary | ICD-10-CM | POA: Diagnosis present

## 2018-04-17 DIAGNOSIS — I2510 Atherosclerotic heart disease of native coronary artery without angina pectoris: Secondary | ICD-10-CM | POA: Diagnosis present

## 2018-04-17 DIAGNOSIS — I6930 Unspecified sequelae of cerebral infarction: Secondary | ICD-10-CM

## 2018-04-17 DIAGNOSIS — N183 Chronic kidney disease, stage 3 (moderate): Secondary | ICD-10-CM | POA: Diagnosis present

## 2018-04-17 DIAGNOSIS — Z800 Family history of malignant neoplasm of digestive organs: Secondary | ICD-10-CM

## 2018-04-17 DIAGNOSIS — K219 Gastro-esophageal reflux disease without esophagitis: Secondary | ICD-10-CM | POA: Diagnosis present

## 2018-04-17 LAB — CBC WITH DIFFERENTIAL/PLATELET
Abs Immature Granulocytes: 0.03 10*3/uL (ref 0.00–0.07)
Basophils Absolute: 0 10*3/uL (ref 0.0–0.1)
Basophils Relative: 0 %
Eosinophils Absolute: 0.1 10*3/uL (ref 0.0–0.5)
Eosinophils Relative: 1 %
HCT: 44 % (ref 39.0–52.0)
Hemoglobin: 14.4 g/dL (ref 13.0–17.0)
Immature Granulocytes: 0 %
Lymphocytes Relative: 11 %
Lymphs Abs: 1 10*3/uL (ref 0.7–4.0)
MCH: 29.4 pg (ref 26.0–34.0)
MCHC: 32.7 g/dL (ref 30.0–36.0)
MCV: 90 fL (ref 80.0–100.0)
Monocytes Absolute: 0.6 10*3/uL (ref 0.1–1.0)
Monocytes Relative: 6 %
Neutro Abs: 7.9 10*3/uL — ABNORMAL HIGH (ref 1.7–7.7)
Neutrophils Relative %: 82 %
Platelets: 208 10*3/uL (ref 150–400)
RBC: 4.89 MIL/uL (ref 4.22–5.81)
RDW: 14.1 % (ref 11.5–15.5)
WBC: 9.6 10*3/uL (ref 4.0–10.5)
nRBC: 0.3 % — ABNORMAL HIGH (ref 0.0–0.2)

## 2018-04-17 MED ORDER — SODIUM CHLORIDE 0.9 % IV BOLUS
1000.0000 mL | Freq: Once | INTRAVENOUS | Status: AC
  Administered 2018-04-17: 23:00:00 1000 mL via INTRAVENOUS

## 2018-04-17 MED ORDER — ONDANSETRON HCL 4 MG/2ML IJ SOLN
4.0000 mg | Freq: Once | INTRAMUSCULAR | Status: AC
  Administered 2018-04-17: 23:00:00 4 mg via INTRAVENOUS
  Filled 2018-04-17: qty 2

## 2018-04-17 NOTE — ED Triage Notes (Signed)
Pt presents by EMS, N/V x3, started 5 hours ago, dizziness. Hasn't eaten all day. AAO x2, confused to date and place unsure if this is normal for pt. Pt had same presentation 2 weeks ago w/ MI.

## 2018-04-17 NOTE — ED Provider Notes (Signed)
[facility] EMERGENCY DEPARTMENT Provider Note   CSN: [id number] Arrival date & time: 04/17/18  2248    History   Chief Complaint No chief complaint on file.   HPI [employer]. is a 68 y.o. male.  The history is provided by the patient.  He has history of diabetes, non-STEMI and comes in because of nausea and vomiting since 4 PM.  He states that for the last 3 days, he has not been able to have a bowel movement although he feels like there is stool there that he cannot pass.  He has passed very little flatus and none today.  He denies abdominal pain.  He denies fever, chills, sweats.  He denies arthralgias or myalgias.  EMS noted low blood pressure and gave IV fluids with improvement in blood pressure.  Of note, he had presented with similar symptoms 2 months ago and actually had a non-STEMI.  He denies any recent travel, denies exposure to people with recent travel, denies exposure to anyone diagnosed with COVID-19.  Past Medical History:  Diagnosis Date   Diabetes mellitus [facility])     Patient Active Problem List   Diagnosis Date Noted   NSTEMI (non-ST elevated myocardial infarction) ([place]) 02/21/2018   Sepsis ([place])    Hypotension    Hypoxemia    Sinus tachycardia    Severe sepsis ([place])    Acute respiratory failure with hypoxia ([place])    Shock ([place]) 02/15/2018   Acute kidney injury (nontraumatic) (HCC)    Dehydration    Leukocytosis    Nausea vomiting and diarrhea    Weight loss 10/19/2016   CKD (chronic kidney disease), stage III ([place]) 10/19/2016   Essential hypertension 10/19/2016   CAD (coronary artery disease) of artery bypass graft 10/19/2016   GERD (gastroesophageal reflux disease) 10/19/2016   Hypoglycemia 10/18/2016    Past Surgical History:  Procedure Laterality Date   CORONARY ARTERY BYPASS GRAFT     LEFT HEART CATH AND CORS/GRAFTS ANGIOGRAPHY N/A 02/23/2018   Procedure: LEFT HEART CATH AND CORS/GRAFTS ANGIOGRAPHY;   Surgeon: Sherren Mocha, MD;  Location: [facility];  Service: Cardiovascular;  Laterality: N/A;        Home Medications    Prior to Admission medications   Medication Sig Start Date End Date Taking? Authorizing Provider  aspirin EC 81 MG tablet Take 81 mg by mouth daily.    [provider]  atorvastatin (LIPITOR) 80 MG tablet Take 1 tablet (80 mg total) by mouth daily at 6 PM. 02/25/18   Asencion Noble, MD  calcium carbonate (TUMS - DOSED IN MG ELEMENTAL CALCIUM) 500 MG chewable tablet Chew 2 tablets by mouth as needed for indigestion or heartburn.    [provider]  citalopram (CELEXA) 20 MG tablet Take 20 mg by mouth daily.    [provider]  clopidogrel (PLAVIX) 75 MG tablet Take 75 mg by mouth daily.    [provider]  insulin NPH Human (HUMULIN N) 100 UNIT/ML injection Inject 0.05 mLs (5 Units total) into the skin 2 (two) times daily before a meal. Patient taking differently: Inject 20 Units into the skin 2 (two) times daily before a meal.  10/20/16   Rosita Fire, MD  isosorbide mononitrate (IMDUR) 30 MG 24 hr tablet Take 1 tablet (30 mg total) by mouth daily. 02/26/18   Asencion Noble, MD  metFORMIN (GLUCOPHAGE) 1000 MG tablet Take 1,000 mg by mouth 2 (two) times daily with a  meal.    [provider]  Metoprolol Tartrate 37.5 MG TABS Take 37.5 mg by mouth 2 (two) times daily. 02/25/18   Asencion Noble, MD  nitroGLYCERIN (NITROLINGUAL) 0.4 MG/SPRAY spray Place 1 spray under the tongue every 5 (five) minutes x 3 doses as needed for chest pain.    [provider]    Family History Family History  Problem Relation Age of Onset   Colon cancer Father     Social History Social History   Tobacco Use   Smoking status: Never Smoker   Smokeless tobacco: Never Used  Substance Use Topics   Alcohol use: No   Drug use: No     Allergies   Codeine   Review of Systems Review of Systems  All  other systems reviewed and are negative.    Physical Exam Updated Vital Signs BP (!) 168/81    Pulse 89    Temp 98.1 F (36.7 C) (Oral)    Resp (!) 23    Ht '5\' 6"'  (1.676 m)    Wt 81.6 kg    SpO2 99%    BMI 29.05 kg/m   Physical Exam Vitals signs and nursing note reviewed.    68 year old male, resting comfortably and in no acute distress. Vital signs are significant for elevated blood pressure and elevated respiratory rate. Oxygen saturation is 99%, which is normal. Head is normocephalic and atraumatic. PERRLA, EOMI. Oropharynx is clear. Neck is nontender and supple without adenopathy or JVD. Back is nontender and there is no CVA tenderness. Lungs are clear without rales, wheezes, or rhonchi. Chest is nontender. Sternotomy scar present, well healed. Heart has regular rate and rhythm without murmur. Abdomen is soft, flat, nontender without masses or hepatosplenomegaly and peristalsis is hypoactive. RUQ surgical scar present, well healed. Extremities have no cyanosis or edema, full range of motion is present. Skin is warm and dry without rash. Neurologic: Mental status is normal, cranial nerves are intact, there are no motor or sensory deficits.  ED Treatments / Results  Labs (all labs ordered are listed, but only abnormal results are displayed) Labs Reviewed  COMPREHENSIVE METABOLIC PANEL - Abnormal; Notable for the following components:      Result Value   Chloride 95 (*)    Glucose, Bld 168 (*)    BUN 29 (*)    Creatinine, Ser 1.82 (*)    Calcium 11.3 (*)    ALT 48 (*)    GFR calc non Af Amer 37 (*)    GFR calc Af Amer 43 (*)    All other components within normal limits  CBC WITH DIFFERENTIAL/PLATELET - Abnormal; Notable for the following components:   nRBC 0.3 (*)    Neutro Abs 7.9 (*)    All other components within normal limits  TROPONIN I - Abnormal; Notable for the following components:   Troponin I 0.03 (*)    All other components within normal limits  LIPASE,  BLOOD  TROPONIN I    EKG Sinus rhythm with frequent PVCs.  Borderline left axis deviation.  Age indeterminant anteroseptal and lateral wall myocardial infarction.  When compared with ECG of February 24, 2018, PVCs are now present.  Radiology Ct Abdomen Pelvis W Contrast  Result Date: 04/18/2018 CLINICAL DATA:  Nausea and vomiting. EXAM: CT ABDOMEN AND PELVIS WITH CONTRAST TECHNIQUE: Multidetector CT imaging of the abdomen and pelvis was performed using the standard protocol following bolus administration of intravenous contrast. CONTRAST:  37m OMNIPAQUE IOHEXOL 300 MG/ML  SOLN COMPARISON:  02/16/2018 FINDINGS: Lower chest: Resolved pleural effusions from prior CT. No acute airspace disease. There are coronary artery calcifications. Hepatobiliary: No focal hepatic lesion. Mild intrahepatic biliary ductal dilatation on the left. Status post cholecystectomy. No biliary dilatation. Pancreas: Mild parenchymal atrophy. No ductal dilatation or inflammation. Spleen: Normal in size without focal abnormality. Adrenals/Urinary Tract: Normal adrenal glands. No hydronephrosis or perinephric edema. Homogeneous renal enhancement with symmetric excretion on delayed phase imaging. Nonobstructing stones in the kidneys versus vascular calcifications. There are small bilateral renal cysts. Urinary bladder is physiologically distended without wall thickening. Stomach/Bowel: Small hiatal hernia. Fluid-filled stomach. Mild pre pyloric gastric wall thickening. No small bowel dilatation, inflammation, or obstruction. Normal appendix. Moderate volume of stool throughout the colon. Stool distends the rectum. Rectal diameter of 6.3 cm without rectal wall thickening. Vascular/Lymphatic: Moderate aortic and branch atherosclerosis. No aneurysm. No enlarged lymph nodes in the abdomen or pelvis. Reproductive: Prominent prostate gland causes mild mass effect on the bladder base. Other: Fat in the inguinal canals, left greater than right.  Subcutaneous densities in the anterior abdominal wall most consistent with medication injection sites No free air, free fluid, or intra-abdominal fluid collection. Musculoskeletal: There are no acute or suspicious osseous abnormalities. Degenerative disc disease at L4-L5. IMPRESSION: 1. Mild pre pyloric gastric wall thickening, may be gastritis or peptic ulcer disease. 2. Moderate colonic stool burden, suggesting constipation. No bowel obstruction or inflammation. 3. Incidental small hiatal hernia. Aortic Atherosclerosis (ICD10-I70.0). Electronically Signed   By: Keith Rake M.D.   On: 04/18/2018 01:39    Procedures Procedures   Medications Ordered in ED Medications  sodium chloride 0.9 % bolus 1,000 mL (has no administration in time range)  sodium chloride 0.9 % bolus 1,000 mL (0 mLs Intravenous Stopped 04/18/18 0053)  ondansetron (ZOFRAN) injection 4 mg (4 mg Intravenous Given 04/17/18 2309)  iohexol (OMNIPAQUE) 300 MG/ML solution 100 mL (100 mLs Intravenous Contrast Given 04/18/18 0052)  metoCLOPramide (REGLAN) injection 10 mg (10 mg Intravenous Given 04/18/18 0203)     Initial Impression / Assessment and Plan / ED Course  I have reviewed the triage vital signs and the nursing notes.  Pertinent labs & imaging results that were available during my care of the patient were reviewed by me and considered in my medical decision making (see chart for details).  Nausea and vomiting with no flatus suggestive of a bowel obstruction.  Alternatively, this could be simple viral gastritis.  He does have history of prior abdominal surgery which would put him at risk for bowel obstruction.  Old records are reviewed confirming hospitalization 2 months ago with presentation of vomiting and diarrhea with shock and eventual diagnosis of non-STEMI.  He will be given IV fluids, ondansetron.  Will check ECG and troponin and will send for CT of abdomen and pelvis.  CT scan shows moderate stool burden but no  evidence of obstruction or any other acute finding.  Labs show evidence of acute kidney injury.  This is concerning given relatively brief duration of vomiting.  He continued to have nausea in spite of ondansetron and is given a dose of metoclopramide.  He is also noted to have hypercalcemia which is new.  Cause of hypercalcemia is not clear.  BUN, creatinine, calcium were all normal on February 18.  It is possible that his constipation and nausea are related to hypercalcemia.  May need to consider evaluation for multiple myeloma given acute kidney injury associated with hypercalcemia.  Case is discussed with Dr. Myna Hidalgo  of [facility], who agrees to admit the patient.  Final Clinical Impressions(s) / ED Diagnoses   Final diagnoses:  Acute kidney injury (nontraumatic) (HCC)  Non-intractable vomiting with nausea, unspecified vomiting type  Hypercalcemia  Constipation, unspecified constipation type    ED Discharge Orders    None       Delora Fuel, MD 17/98/10 [phone]

## 2018-04-18 ENCOUNTER — Emergency Department (HOSPITAL_COMMUNITY): Payer: Medicare Other

## 2018-04-18 ENCOUNTER — Other Ambulatory Visit: Payer: Self-pay

## 2018-04-18 ENCOUNTER — Encounter (HOSPITAL_COMMUNITY): Payer: Self-pay | Admitting: Family Medicine

## 2018-04-18 DIAGNOSIS — N179 Acute kidney failure, unspecified: Secondary | ICD-10-CM | POA: Diagnosis present

## 2018-04-18 DIAGNOSIS — Z885 Allergy status to narcotic agent status: Secondary | ICD-10-CM | POA: Diagnosis not present

## 2018-04-18 DIAGNOSIS — K449 Diaphragmatic hernia without obstruction or gangrene: Secondary | ICD-10-CM | POA: Diagnosis present

## 2018-04-18 DIAGNOSIS — I69320 Aphasia following cerebral infarction: Secondary | ICD-10-CM | POA: Diagnosis not present

## 2018-04-18 DIAGNOSIS — I129 Hypertensive chronic kidney disease with stage 1 through stage 4 chronic kidney disease, or unspecified chronic kidney disease: Secondary | ICD-10-CM | POA: Diagnosis present

## 2018-04-18 DIAGNOSIS — E1122 Type 2 diabetes mellitus with diabetic chronic kidney disease: Secondary | ICD-10-CM | POA: Diagnosis present

## 2018-04-18 DIAGNOSIS — Z800 Family history of malignant neoplasm of digestive organs: Secondary | ICD-10-CM | POA: Diagnosis not present

## 2018-04-18 DIAGNOSIS — Z7982 Long term (current) use of aspirin: Secondary | ICD-10-CM | POA: Diagnosis not present

## 2018-04-18 DIAGNOSIS — I252 Old myocardial infarction: Secondary | ICD-10-CM | POA: Diagnosis not present

## 2018-04-18 DIAGNOSIS — Z951 Presence of aortocoronary bypass graft: Secondary | ICD-10-CM | POA: Diagnosis not present

## 2018-04-18 DIAGNOSIS — I2510 Atherosclerotic heart disease of native coronary artery without angina pectoris: Secondary | ICD-10-CM | POA: Diagnosis present

## 2018-04-18 DIAGNOSIS — I6930 Unspecified sequelae of cerebral infarction: Secondary | ICD-10-CM | POA: Diagnosis not present

## 2018-04-18 DIAGNOSIS — R112 Nausea with vomiting, unspecified: Secondary | ICD-10-CM

## 2018-04-18 DIAGNOSIS — I25810 Atherosclerosis of coronary artery bypass graft(s) without angina pectoris: Secondary | ICD-10-CM | POA: Diagnosis not present

## 2018-04-18 DIAGNOSIS — Z794 Long term (current) use of insulin: Secondary | ICD-10-CM | POA: Diagnosis not present

## 2018-04-18 DIAGNOSIS — N183 Chronic kidney disease, stage 3 (moderate): Secondary | ICD-10-CM | POA: Diagnosis present

## 2018-04-18 DIAGNOSIS — Z7902 Long term (current) use of antithrombotics/antiplatelets: Secondary | ICD-10-CM | POA: Diagnosis not present

## 2018-04-18 DIAGNOSIS — K219 Gastro-esophageal reflux disease without esophagitis: Secondary | ICD-10-CM | POA: Diagnosis present

## 2018-04-18 DIAGNOSIS — K5900 Constipation, unspecified: Secondary | ICD-10-CM | POA: Diagnosis present

## 2018-04-18 DIAGNOSIS — E119 Type 2 diabetes mellitus without complications: Secondary | ICD-10-CM

## 2018-04-18 DIAGNOSIS — E860 Dehydration: Secondary | ICD-10-CM | POA: Diagnosis present

## 2018-04-18 LAB — COMPREHENSIVE METABOLIC PANEL
ALT: 48 U/L — ABNORMAL HIGH (ref 0–44)
ALT: 41 U/L (ref 0–44)
AST: 30 U/L (ref 15–41)
AST: 23 U/L (ref 15–41)
Albumin: 4.1 g/dL (ref 3.5–5.0)
Albumin: 3.6 g/dL (ref 3.5–5.0)
Alkaline Phosphatase: 74 U/L (ref 38–126)
Alkaline Phosphatase: 71 U/L (ref 38–126)
Anion gap: 14 (ref 5–15)
Anion gap: 15 (ref 5–15)
BUN: 29 mg/dL — ABNORMAL HIGH (ref 8–23)
BUN: 27 mg/dL — ABNORMAL HIGH (ref 8–23)
CO2: 29 mmol/L (ref 22–32)
CO2: 26 mmol/L (ref 22–32)
Calcium: 11.3 mg/dL — ABNORMAL HIGH (ref 8.9–10.3)
Calcium: 10.1 mg/dL (ref 8.9–10.3)
Chloride: 95 mmol/L — ABNORMAL LOW (ref 98–111)
Chloride: 97 mmol/L — ABNORMAL LOW (ref 98–111)
Creatinine, Ser: 1.82 mg/dL — ABNORMAL HIGH (ref 0.61–1.24)
Creatinine, Ser: 1.72 mg/dL — ABNORMAL HIGH (ref 0.61–1.24)
GFR calc Af Amer: 43 mL/min — ABNORMAL LOW (ref >60)
GFR calc Af Amer: 46 mL/min — ABNORMAL LOW (ref >60)
GFR calc non Af Amer: 37 mL/min — ABNORMAL LOW (ref >60)
GFR calc non Af Amer: 40 mL/min — ABNORMAL LOW (ref >60)
Glucose, Bld: 168 mg/dL — ABNORMAL HIGH (ref 70–99)
Glucose, Bld: 194 mg/dL — ABNORMAL HIGH (ref 70–99)
Potassium: 4.9 mmol/L (ref 3.5–5.1)
Potassium: 5.1 mmol/L (ref 3.5–5.1)
Sodium: 138 mmol/L (ref 135–145)
Sodium: 138 mmol/L (ref 135–145)
Total Bilirubin: 0.9 mg/dL (ref 0.3–1.2)
Total Bilirubin: 1.1 mg/dL (ref 0.3–1.2)
Total Protein: 7.3 g/dL (ref 6.5–8.1)
Total Protein: 6.3 g/dL — ABNORMAL LOW (ref 6.5–8.1)

## 2018-04-18 LAB — URINALYSIS, COMPLETE (UACMP) WITH MICROSCOPIC
Bacteria, UA: NONE SEEN
Bilirubin Urine: NEGATIVE
Glucose, UA: 50 mg/dL — AB
Hgb urine dipstick: NEGATIVE
Ketones, ur: 20 mg/dL — AB
Leukocytes,Ua: NEGATIVE
Nitrite: NEGATIVE
Protein, ur: NEGATIVE mg/dL
Specific Gravity, Urine: 1.034 — ABNORMAL HIGH (ref 1.005–1.030)
pH: 6 (ref 5.0–8.0)

## 2018-04-18 LAB — CBC WITH DIFFERENTIAL/PLATELET
Abs Immature Granulocytes: 0.04 10*3/uL (ref 0.00–0.07)
Basophils Absolute: 0 10*3/uL (ref 0.0–0.1)
Basophils Relative: 0 %
Eosinophils Absolute: 0.1 10*3/uL (ref 0.0–0.5)
Eosinophils Relative: 1 %
HCT: 40.6 % (ref 39.0–52.0)
Hemoglobin: 13.5 g/dL (ref 13.0–17.0)
Immature Granulocytes: 0 %
Lymphocytes Relative: 9 %
Lymphs Abs: 0.8 10*3/uL (ref 0.7–4.0)
MCH: 29.7 pg (ref 26.0–34.0)
MCHC: 33.3 g/dL (ref 30.0–36.0)
MCV: 89.2 fL (ref 80.0–100.0)
Monocytes Absolute: 0.6 10*3/uL (ref 0.1–1.0)
Monocytes Relative: 6 %
Neutro Abs: 7.9 10*3/uL — ABNORMAL HIGH (ref 1.7–7.7)
Neutrophils Relative %: 84 %
Platelets: 171 10*3/uL (ref 150–400)
RBC: 4.55 MIL/uL (ref 4.22–5.81)
RDW: 14 % (ref 11.5–15.5)
WBC: 9.3 10*3/uL (ref 4.0–10.5)
nRBC: 0 % (ref 0.0–0.2)

## 2018-04-18 LAB — GLUCOSE, CAPILLARY
Glucose-Capillary: 137 mg/dL — ABNORMAL HIGH (ref 70–99)
Glucose-Capillary: 138 mg/dL — ABNORMAL HIGH (ref 70–99)
Glucose-Capillary: 141 mg/dL — ABNORMAL HIGH (ref 70–99)
Glucose-Capillary: 148 mg/dL — ABNORMAL HIGH (ref 70–99)
Glucose-Capillary: 148 mg/dL — ABNORMAL HIGH (ref 70–99)
Glucose-Capillary: 151 mg/dL — ABNORMAL HIGH (ref 70–99)
Glucose-Capillary: 182 mg/dL — ABNORMAL HIGH (ref 70–99)
Glucose-Capillary: 197 mg/dL — ABNORMAL HIGH (ref 70–99)

## 2018-04-18 LAB — CREATININE, URINE, RANDOM: Creatinine, Urine: 78.65 mg/dL

## 2018-04-18 LAB — TROPONIN I
Troponin I: 0.03 ng/mL (ref <0.03)
Troponin I: 0.04 ng/mL (ref <0.03)
Troponin I: 0.06 ng/mL (ref <0.03)

## 2018-04-18 LAB — MAGNESIUM: Magnesium: 1.2 mg/dL — ABNORMAL LOW (ref 1.7–2.4)

## 2018-04-18 LAB — LIPASE, BLOOD: Lipase: 22 U/L (ref 11–51)

## 2018-04-18 LAB — MRSA PCR SCREENING: MRSA by PCR: NEGATIVE

## 2018-04-18 LAB — SODIUM, URINE, RANDOM: Sodium, Ur: 148 mmol/L

## 2018-04-18 MED ORDER — ACETAMINOPHEN 650 MG RE SUPP: 650.0000 mg | Freq: Four times a day (QID) | RECTAL | Status: DC | PRN

## 2018-04-18 MED ORDER — ACETAMINOPHEN 325 MG PO TABS: 650.0000 mg | ORAL_TABLET | Freq: Four times a day (QID) | ORAL | Status: DC | PRN

## 2018-04-18 MED ORDER — MAGNESIUM SULFATE 2 GM/50ML IV SOLN
2.0000 g | Freq: Once | INTRAVENOUS | Status: AC
  Administered 2018-04-18: 2 g via INTRAVENOUS
  Filled 2018-04-18: qty 50

## 2018-04-18 MED ORDER — ENSURE ENLIVE PO LIQD
237.0000 mL | Freq: Two times a day (BID) | ORAL | Status: DC
  Administered 2018-04-18 – 2018-04-21 (×4): 237 mL via ORAL

## 2018-04-18 MED ORDER — HYDRALAZINE HCL 20 MG/ML IJ SOLN: 10.0000 mg | INTRAMUSCULAR | Status: DC | PRN

## 2018-04-18 MED ORDER — SODIUM CHLORIDE 0.9 % IV SOLN
INTRAVENOUS | Status: AC
  Administered 2018-04-18 (×3): via INTRAVENOUS

## 2018-04-18 MED ORDER — PANTOPRAZOLE SODIUM 40 MG IV SOLR
40.0000 mg | INTRAVENOUS | Status: DC
  Administered 2018-04-18 – 2018-04-20 (×3): 40 mg via INTRAVENOUS
  Filled 2018-04-18 (×3): qty 40

## 2018-04-18 MED ORDER — INSULIN ASPART 100 UNIT/ML ~~LOC~~ SOLN
0.0000 [IU] | SUBCUTANEOUS | Status: DC
  Administered 2018-04-18 (×3): 2 [IU] via SUBCUTANEOUS
  Administered 2018-04-18 – 2018-04-19 (×3): 3 [IU] via SUBCUTANEOUS
  Administered 2018-04-19: 13:00:00 5 [IU] via SUBCUTANEOUS

## 2018-04-18 MED ORDER — CLOPIDOGREL BISULFATE 75 MG PO TABS
75.0000 mg | ORAL_TABLET | Freq: Every day | ORAL | Status: DC
  Administered 2018-04-18 – 2018-04-21 (×4): 75 mg via ORAL
  Filled 2018-04-18 (×4): qty 1

## 2018-04-18 MED ORDER — ATORVASTATIN CALCIUM 80 MG PO TABS
80.0000 mg | ORAL_TABLET | Freq: Every day | ORAL | Status: DC
  Administered 2018-04-18 – 2018-04-20 (×3): 80 mg via ORAL
  Filled 2018-04-18 (×4): qty 1

## 2018-04-18 MED ORDER — SODIUM CHLORIDE 0.9 % IV BOLUS
1000.0000 mL | Freq: Once | INTRAVENOUS | Status: AC
  Administered 2018-04-18: 1000 mL via INTRAVENOUS

## 2018-04-18 MED ORDER — HYDROCODONE-ACETAMINOPHEN 5-325 MG PO TABS: 1.0000 | ORAL_TABLET | ORAL | Status: DC | PRN

## 2018-04-18 MED ORDER — SODIUM CHLORIDE 0.9 % IV SOLN
INTRAVENOUS | Status: DC
  Administered 2018-04-18: 04:00:00 via INTRAVENOUS

## 2018-04-18 MED ORDER — HEPARIN SODIUM (PORCINE) 5000 UNIT/ML IJ SOLN
5000.0000 [IU] | Freq: Three times a day (TID) | INTRAMUSCULAR | Status: DC
  Administered 2018-04-18 – 2018-04-21 (×8): 5000 [IU] via SUBCUTANEOUS
  Filled 2018-04-18 (×8): qty 1

## 2018-04-18 MED ORDER — PANTOPRAZOLE SODIUM 40 MG PO TBEC: 40.0000 mg | DELAYED_RELEASE_TABLET | Freq: Every day | ORAL | Status: DC

## 2018-04-18 MED ORDER — BISACODYL 5 MG PO TBEC: 5.0000 mg | DELAYED_RELEASE_TABLET | Freq: Every day | ORAL | Status: DC | PRN

## 2018-04-18 MED ORDER — PANTOPRAZOLE SODIUM 40 MG IV SOLR
40.0000 mg | Freq: Once | INTRAVENOUS | Status: AC
  Administered 2018-04-18: 40 mg via INTRAVENOUS
  Filled 2018-04-18: qty 40

## 2018-04-18 MED ORDER — PNEUMOCOCCAL VAC POLYVALENT 25 MCG/0.5ML IJ INJ
0.5000 mL | INJECTION | INTRAMUSCULAR | Status: DC
  Filled 2018-04-18: qty 0.5

## 2018-04-18 MED ORDER — ONDANSETRON HCL 4 MG PO TABS: 4.0000 mg | ORAL_TABLET | Freq: Four times a day (QID) | ORAL | Status: DC | PRN

## 2018-04-18 MED ORDER — PROMETHAZINE HCL 25 MG/ML IJ SOLN: 12.5000 mg | Freq: Four times a day (QID) | INTRAMUSCULAR | Status: DC | PRN

## 2018-04-18 MED ORDER — METOPROLOL TARTRATE 25 MG PO TABS
37.5000 mg | ORAL_TABLET | Freq: Two times a day (BID) | ORAL | Status: DC
  Administered 2018-04-18 – 2018-04-21 (×8): 37.5 mg via ORAL
  Filled 2018-04-18 (×8): qty 2

## 2018-04-18 MED ORDER — ISOSORBIDE MONONITRATE ER 30 MG PO TB24
30.0000 mg | ORAL_TABLET | Freq: Every day | ORAL | Status: DC
  Administered 2018-04-18 – 2018-04-21 (×4): 30 mg via ORAL
  Filled 2018-04-18 (×4): qty 1

## 2018-04-18 MED ORDER — SODIUM CHLORIDE 0.9% FLUSH
3.0000 mL | Freq: Two times a day (BID) | INTRAVENOUS | Status: DC
  Administered 2018-04-18 – 2018-04-21 (×6): 3 mL via INTRAVENOUS

## 2018-04-18 MED ORDER — IOHEXOL 300 MG/ML  SOLN
100.0000 mL | Freq: Once | INTRAMUSCULAR | Status: AC | PRN
  Administered 2018-04-18: 100 mL via INTRAVENOUS

## 2018-04-18 MED ORDER — ONDANSETRON HCL 4 MG/2ML IJ SOLN: 4.0000 mg | Freq: Four times a day (QID) | INTRAMUSCULAR | Status: DC | PRN

## 2018-04-18 MED ORDER — METOCLOPRAMIDE HCL 5 MG/ML IJ SOLN
10.0000 mg | Freq: Once | INTRAMUSCULAR | Status: AC
  Administered 2018-04-18: 10 mg via INTRAVENOUS
  Filled 2018-04-18: qty 2

## 2018-04-18 MED ORDER — CITALOPRAM HYDROBROMIDE 10 MG PO TABS
20.0000 mg | ORAL_TABLET | Freq: Every day | ORAL | Status: DC
  Administered 2018-04-18 – 2018-04-21 (×4): 20 mg via ORAL
  Filled 2018-04-18 (×4): qty 2

## 2018-04-18 MED ORDER — POLYETHYLENE GLYCOL 3350 17 G PO PACK: 17.0000 g | PACK | Freq: Every day | ORAL | Status: DC | PRN

## 2018-04-18 MED ORDER — ASPIRIN EC 81 MG PO TBEC
81.0000 mg | DELAYED_RELEASE_TABLET | Freq: Every day | ORAL | Status: DC
  Administered 2018-04-19 – 2018-04-21 (×3): 81 mg via ORAL
  Filled 2018-04-18 (×3): qty 1

## 2018-04-18 NOTE — ED Notes (Signed)
Patient transported to CT 

## 2018-04-18 NOTE — Progress Notes (Signed)
Patient seen and examined personally, I reviewed the chart, history and physical and admission note, done by admitting physician this morning and agree with the same with following addendum.  Please refer to the morning admission note for more detailed plan of care.  Briefly, 68 year old male with history of CAD, IDDM, history of CVA with residual deficits, who is a poor historian presented with nausea vomiting.  In the ER found to have mild AKI with creatinine 1.8 from baseline 0.9 in February, calcium 11.3, was given IV fluids and was admitted for further management. Per family he was vomtting for few days worse last night. Was not eating well few days. He has [facility], told to go to hospital but refused to go to hospital.  Patient is alert awake appears somewhat forgetful, oriented to place, but not able to tell me the exact date current president, seems to be confabulating with his answers.  Reports he were at the bedside floor as he could not get to bathroom  AKI,Suspect prerenal from volume depletion with nausea vomiting.  Creatinine downtrending, continue gentle IV fluids, is voiding well/incontinent.  Nausea vomiting, denies any episodes this morning, on clear liquid diet, advance diet to soft diet, continue symptomatic management with PPI, antiemetics.  Mild hypercalcemia secondary to multiple repletion, calcium down trended to 10.1.  CAD/ Positive troponin, flat 0.04, 0.06, without chest pain or acute EKG changes.Continue aspirin, Plavix statin and beta-blocker history of CAD.  History of CVA/memory issues/poor historian: I called his contact-ex-wife who lives with him and has grandson who is 38 yo who essentially takes care of him. They live in house with owner who has medicla issues, they feel he may need to go to NH.He has memory issues and progressively worse since his stroke.He is at baseline irate, curses all family, and mentally was getting worse. He was in Rehab  recently. Consult SW to help. PT/OT eval.  IDDM: HBA1C AT 7.7 02/19/18, blood sugar well controlled,Continue sliding scale insulin

## 2018-04-18 NOTE — H&P (Signed)
History and Physical    Campbell [employer]. GNF:[id number] DOB: [birth year] DOA: 04/17/2018  PCP: System, Pcp Not In   Patient coming from: Home   Chief Complaint: Nausea, vomiting   HPI: Philip [name]. is a 68 y.o. male with medical history significant for coronary artery disease, insulin-dependent diabetes mellitus, history of CVA with residual deficits, now presenting to the emergency department with nausea and vomiting.  Patient reports that he had been in his usual state of health until developing nausea couple days ago and had experiencing roughly 5 episodes of nonbloody vomiting in the past day.  Patient is a poor historian, giving conflicting accounts at times, but denies diarrhea or abdominal pain.  He has not noted any fevers or chills.  No travel or sick contacts.  Denies chest pain, palpitations, shortness of breath, or cough.  ED Course: Upon arrival to the ED, patient is found to be afebrile, saturating well on room air, hypertensive to 170/120.  Chemistry panel is notable for BUN of 29 and creatinine 1.82, up from 0.99 in February.  Calcium is 11.3 with normal albumin.  CBC is unremarkable.  Troponin is 0.03.  Patient was given a liter of normal saline, Reglan, and Zofran in the emergency department and hospitalist were asked to admit for further evaluation and management of acute kidney injury.  Review of Systems:  All other systems reviewed and apart from HPI, are negative.  Past Medical History:  Diagnosis Date   Diabetes mellitus (HCC)     Past Surgical History:  Procedure Laterality Date   CORONARY ARTERY BYPASS GRAFT     LEFT HEART CATH AND CORS/GRAFTS ANGIOGRAPHY N/A 02/23/2018   Procedure: LEFT HEART CATH AND CORS/GRAFTS ANGIOGRAPHY;  Surgeon: Tonny Bollman, MD;  Location: [facility]IVE [facility];  Service: Cardiovascular;  Laterality: N/A;     reports that he has never smoked. He has never used smokeless tobacco. He reports that he does not drink alcohol or use  drugs.  Allergies  Allergen Reactions   Codeine     GI upset    Family History  Problem Relation Age of Onset   Colon cancer Father      Prior to Admission medications   Medication Sig Start Date End Date Taking? Authorizing Provider  aspirin EC 81 MG tablet Take 81 mg by mouth daily.    [provider]  atorvastatin (LIPITOR) 80 MG tablet Take 1 tablet (80 mg total) by mouth daily at 6 PM. 02/25/18   Claudean Severance, MD  calcium carbonate (TUMS - DOSED IN MG ELEMENTAL CALCIUM) 500 MG chewable tablet Chew 2 tablets by mouth as needed for indigestion or heartburn.    [provider]  citalopram (CELEXA) 20 MG tablet Take 20 mg by mouth daily.    [provider]  clopidogrel (PLAVIX) 75 MG tablet Take 75 mg by mouth daily.    [provider]  insulin NPH Human (HUMULIN N) 100 UNIT/ML injection Inject 0.05 mLs (5 Units total) into the skin 2 (two) times daily before a meal. Patient taking differently: Inject 20 Units into the skin 2 (two) times daily before a meal.  10/20/16   [name], MD  isosorbide mononitrate (IMDUR) 30 MG 24 hr tablet Take 1 tablet (30 mg total) by mouth daily. 02/26/18   Claudean Severance, MD  metFORMIN (GLUCOPHAGE) 1000 MG tablet Take 1,000 mg by mouth 2 (two) times daily with a meal.    [provider]  Metoprolol Tartrate 37.5 MG TABS Take 37.5 mg by mouth 2 (two) times daily. 02/25/18   Claudean Severance, MD  nitroGLYCERIN (NITROLINGUAL) 0.4 MG/SPRAY spray Place 1 spray under the tongue every 5 (five) minutes x 3 doses as needed for chest pain.    [provider]    Physical Exam: Vitals:   04/17/18 2259 04/17/18 2315 04/18/18 0000 04/18/18 0200  BP:  (!) 168/79 (!) 169/119 (!) 168/81  Pulse:  82 86 89  Resp:  (!) 21 13 (!) 23  Temp:      TempSrc:      SpO2:  98% 100% 99%  Weight: 81.6 kg     Height:  (1.676 m)       Constitutional: NAD, calm  Eyes: PERTLA, lids and  conjunctivae normal ENMT: Mucous membranes are moist. Posterior pharynx clear of any exudate or lesions.   Neck: normal, supple, no masses, no thyromegaly Respiratory: clear to auscultation bilaterally, no wheezing, no crackles. No accessory muscle use.  Cardiovascular: S1 & S2 heard, regular rate and rhythm. No extremity edema.   Abdomen: No distension, no tenderness, soft. Bowel sounds active.  Musculoskeletal: no clubbing / cyanosis. No joint deformity upper and lower extremities.   Skin: no significant rashes, lesions, ulcers. Poor turgor.  Neurologic: No gross facial asymmetry. Sensation intact. Moving all extremities.  Psychiatric: Alert and oriented to person and place only. Pleasant, cooperative.    Labs on Admission: I have personally reviewed following labs and imaging studies  CBC: Recent Labs  Lab 04/17/18 2303  WBC 9.6  NEUTROABS 7.9*  HGB 14.4  HCT 44.0  MCV 90.0  PLT 208   Basic Metabolic Panel: Recent Labs  Lab 04/17/18 2303  NA 138  K 4.9  CL 95*  CO2 29  GLUCOSE 168*  BUN 29*  CREATININE 1.82*  CALCIUM 11.3*   GFR: Estimated Creatinine Clearance: 38.4 mL/min (A) (by C-G formula based on SCr of 1.82 mg/dL (H)). Liver Function Tests: Recent Labs  Lab 04/17/18 2303  AST 30  ALT 48*  ALKPHOS 74  BILITOT 0.9  PROT 7.3  ALBUMIN 4.1   Recent Labs  Lab 04/17/18 2303  LIPASE 22   No results for input(s): AMMONIA in the last 168 hours. Coagulation Profile: No results for input(s): INR, PROTIME in the last 168 hours. Cardiac Enzymes: Recent Labs  Lab 04/17/18 2303  TROPONINI 0.03*   BNP (last 3 results) No results for input(s): PROBNP in the last 8760 hours. HbA1C: No results for input(s): HGBA1C in the last 72 hours. CBG: No results for input(s): GLUCAP in the last 168 hours. Lipid Profile: No results for input(s): CHOL, HDL, LDLCALC, TRIG, CHOLHDL, LDLDIRECT in the last 72 hours. Thyroid Function Tests: No results for input(s): TSH,  T4TOTAL, FREET4, T3FREE, THYROIDAB in the last 72 hours. Anemia Panel: No results for input(s): VITAMINB12, FOLATE, FERRITIN, TIBC, IRON, RETICCTPCT in the last 72 hours. Urine analysis:    Component Value Date/Time   COLORURINE YELLOW 02/15/2018 1334   APPEARANCEUR CLEAR 02/15/2018 1334   LABSPEC 1.019 02/15/2018 1334   PHURINE 5.0 02/15/2018 1334   GLUCOSEU NEGATIVE 02/15/2018 1334   HGBUR NEGATIVE 02/15/2018 1334   BILIRUBINUR NEGATIVE 02/15/2018 1334   KETONESUR NEGATIVE 02/15/2018 1334   PROTEINUR NEGATIVE 02/15/2018 1334   NITRITE NEGATIVE 02/15/2018 1334   LEUKOCYTESUR NEGATIVE 02/15/2018 1334   Sepsis Labs: (procalcitonin:4,lacticidven:4) )No results found for this or any previous visit (from the past 240 hour(s)).   Radiological Exams on  Admission: Ct Abdomen Pelvis W Contrast  Result Date: 04/18/2018 CLINICAL DATA:  Nausea and vomiting. EXAM: CT ABDOMEN AND PELVIS WITH CONTRAST TECHNIQUE: Multidetector CT imaging of the abdomen and pelvis was performed using the standard protocol following bolus administration of intravenous contrast. CONTRAST:  80mL OMNIPAQUE IOHEXOL 300 MG/ML  SOLN COMPARISON:  02/16/2018 FINDINGS: Lower chest: Resolved pleural effusions from prior CT. No acute airspace disease. There are coronary artery calcifications. Hepatobiliary: No focal hepatic lesion. Mild intrahepatic biliary ductal dilatation on the left. Status post cholecystectomy. No biliary dilatation. Pancreas: Mild parenchymal atrophy. No ductal dilatation or inflammation. Spleen: Normal in size without focal abnormality. Adrenals/Urinary Tract: Normal adrenal glands. No hydronephrosis or perinephric edema. Homogeneous renal enhancement with symmetric excretion on delayed phase imaging. Nonobstructing stones in the kidneys versus vascular calcifications. There are small bilateral renal cysts. Urinary bladder is physiologically distended without wall thickening. Stomach/Bowel: Small  hiatal hernia. Fluid-filled stomach. Mild pre pyloric gastric wall thickening. No small bowel dilatation, inflammation, or obstruction. Normal appendix. Moderate volume of stool throughout the colon. Stool distends the rectum. Rectal diameter of 6.3 cm without rectal wall thickening. Vascular/Lymphatic: Moderate aortic and branch atherosclerosis. No aneurysm. No enlarged lymph nodes in the abdomen or pelvis. Reproductive: Prominent prostate gland causes mild mass effect on the bladder base. Other: Fat in the inguinal canals, left greater than right. Subcutaneous densities in the anterior abdominal wall most consistent with medication injection sites No free air, free fluid, or intra-abdominal fluid collection. Musculoskeletal: There are no acute or suspicious osseous abnormalities. Degenerative disc disease at L4-L5. IMPRESSION: 1. Mild pre pyloric gastric wall thickening, may be gastritis or peptic ulcer disease. 2. Moderate colonic stool burden, suggesting constipation. No bowel obstruction or inflammation. 3. Incidental small hiatal hernia. Aortic Atherosclerosis (ICD10-I70.0). Electronically Signed   By: Narda Rutherford M.D.   On: 04/18/2018 01:39    EKG: Ordered, not yet performed.   Assessment/Plan  1. Acute kidney injury  - SCr is 1.82 on admission, up from apparent baseline of ~1  - Likely prerenal azotemia in setting of N/V  - Given a liter NS in ED  - Check UA and urine chemistries, continue IVF hydration, renally-dose medications, avoid nephrotoxins, and repeat chem panel in am    2. Nausea, vomiting  - Presents with 1-2 days of N/V  - Abdominal exam is benign, CT abd/pelvis with gastric wall-thickening, moderate colonic stool burden, and small hiatal hernia  - Improved with Reglan and Zofran in ED  - Continue IVF hydration, antiemetics, PPI, and monitor renal function and electrolytes    3. CAD  - No anginal complaints  - Troponin slightly elevated in ED  - Check EKG, trend  troponin, continue ASA, Plavix, statin, and beta-blocker as tolerated    4. History of CVA - Residual cognitive and left-sided motor deficits  - Continue ASA, Plavix, and statin when N/V improves    5. Hypercalcemia - Serum calcium is 11.3 on admission with normal albumin, previously normal - Likely secondary to dehydration and TUMS use  - Continue IVF hydration, repeat chemistries in am   6. Insulin-dependent DM - A1c was 7.7% in February  - Managed at home with metformin and human insulin 20 units BID  - Check CBG's and use a SSI with Novolog for now     PPE: Mask, face shield DVT prophylaxis: sq heparin Code Status: Full Family Communication: Discussed  Consults called: None Admission status: Observation    Briscoe Deutscher, MD [facility] Pager [phone]  If 7PM-7AM, please contact night-coverage [email or web address] Password [email or web address]  04/18/2018, 2:41 AM

## 2018-04-18 NOTE — ED Notes (Signed)
ED Provider at bedside. 

## 2018-04-18 NOTE — ED Notes (Signed)
ED TO INPATIENT HANDOFF REPORT  ED Nurse Name and Phone #: Raina Mina [phone]  S Name/Age/Gender Philip Ridge Mazzoni Jr. 68 y.o. male Room/Bed: 015C/015C  Code Status   Code Status: Prior  Home/SNF/Other Home Patient oriented to: self and situation Is this baseline? Yes   Triage Complete: Triage complete  Chief Complaint N/V  Triage Note Pt presents by EMS, N/V x3, started 5 hours ago, dizziness. Hasn't eaten all day. AAO x2, confused to date and place unsure if this is normal for pt. Pt had same presentation 2 weeks ago w/ MI.   Allergies Allergies  Allergen Reactions  . Codeine     GI upset    Level of Care/Admitting Diagnosis ED Disposition    ED Disposition Condition Comment   Admit  Hospital Area: [facility] [[id number]]  Level of Care: Telemetry Cardiac [103]  I expect the patient will be discharged within 24 hours: Yes  LOW acuity---Tx typically complete <24 hrs---ACUTE conditions typically can be evaluated <24 hours---LABS likely to return to acceptable levels <24 hours---IS near functional baseline---EXPECTED to return to current living arrangement---NOT newly hypoxic: Does not meet criteria for 5C-Observation unit  Diagnosis: AKI (acute kidney injury) [facility]) [[id number]]  Admitting Physician: Briscoe Deutscher [[id number]]  Attending Physician: Briscoe Deutscher [[id number]]  PT Class (Do Not Modify): Observation [104]  PT Acc Code (Do Not Modify): Observation [10022]       B Medical/Surgery History Past Medical History:  Diagnosis Date  . Diabetes mellitus (HCC)    Past Surgical History:  Procedure Laterality Date  . CORONARY ARTERY BYPASS GRAFT    . LEFT HEART CATH AND CORS/GRAFTS ANGIOGRAPHY N/A 02/23/2018   Procedure: LEFT HEART CATH AND CORS/GRAFTS ANGIOGRAPHY;  Surgeon: Tonny Bollman, MD;  Location: [facility] INVASIVE CV LAB;  Service: Cardiovascular;  Laterality: N/A;     A IV Location/Drains/Wounds Patient Lines/Drains/Airways Status   Active  Line/Drains/Airways    Name:   Placement date:   Placement time:   Site:   Days:   Peripheral IV 04/17/18 Left Hand   04/17/18    2255    Hand   1   Incision (Closed) 02/23/18 Groin Right   02/23/18    1200     54          Intake/Output Last 24 hours No intake or output data in the 24 hours ending 04/18/18 0237  Labs/Imaging Results for orders placed or performed during the hospital encounter of 04/17/18 (from the past 48 hour(s))  Comprehensive metabolic panel     Status: Abnormal   Collection Time: 04/17/18 11:03 PM  Result Value Ref Range   Sodium 138 135 - 145 mmol/L   Potassium 4.9 3.5 - 5.1 mmol/L   Chloride 95 (L) 98 - 111 mmol/L   CO2 29 22 - 32 mmol/L   Glucose, Bld 168 (H) 70 - 99 mg/dL   BUN 29 (H) 8 - 23 mg/dL   Creatinine, Ser 0.86 (H) 0.61 - 1.24 mg/dL   Calcium 57.8 (H) 8.9 - 10.3 mg/dL   Total Protein 7.3 6.5 - 8.1 g/dL   Albumin 4.1 3.5 - 5.0 g/dL   AST 30 15 - 41 U/L   ALT 48 (H) 0 - 44 U/L   Alkaline Phosphatase 74 38 - 126 U/L   Total Bilirubin 0.9 0.3 - 1.2 mg/dL   GFR calc non Af Amer 37 (L) >60 mL/min   GFR calc Af Amer 43 (L) >60 mL/min   Anion  gap 14 5 - 15    Comment: Performed at [facility], [address] [place], [place] [address]  Lipase, blood     Status: None   Collection Time: 04/17/18 11:03 PM  Result Value Ref Range   Lipase 22 11 - 51 U/L    Comment: Performed at [facility], [address] [place], [place] [address]  CBC with Differential     Status: Abnormal   Collection Time: 04/17/18 11:03 PM  Result Value Ref Range   WBC 9.6 4.0 - 10.5 K/uL   RBC 4.89 4.22 - 5.81 MIL/uL   Hemoglobin 14.4 13.0 - 17.0 g/dL   HCT 09.844.0 11.939.0 - 14.752.0 %   MCV 90.0 80.0 - 100.0 fL   MCH 29.4 26.0 - 34.0 pg   MCHC 32.7 30.0 - 36.0 g/dL   RDW 82.914.1 56.211.5 - 13.015.5 %   Platelets 208 150 - 400 K/uL   nRBC 0.3 (H) 0.0 - 0.2 %   Neutrophils Relative % 82 %   Neutro Abs 7.9 (H) 1.7 - 7.7 K/uL   Lymphocytes Relative 11 %   Lymphs Abs 1.0 0.7  - 4.0 K/uL   Monocytes Relative 6 %   Monocytes Absolute 0.6 0.1 - 1.0 K/uL   Eosinophils Relative 1 %   Eosinophils Absolute 0.1 0.0 - 0.5 K/uL   Basophils Relative 0 %   Basophils Absolute 0.0 0.0 - 0.1 K/uL   Immature Granulocytes 0 %   Abs Immature Granulocytes 0.03 0.00 - 0.07 K/uL    Comment: Performed at [facility], [address] [place], [place] [address]  Troponin I - Once     Status: Abnormal   Collection Time: 04/17/18 11:03 PM  Result Value Ref Range   Troponin I 0.03 (HH) <0.03 ng/mL    Comment: CRITICAL RESULT CALLED TO, READ BACK BY AND VERIFIED WITH: PHILLIPS T,RN 04/18/18 0009 WAYK Performed at [facility], [address] [place], [place] [address]    Ct Abdomen Pelvis W Contrast  Result Date: 04/18/2018 CLINICAL DATA:  Nausea and vomiting. EXAM: CT ABDOMEN AND PELVIS WITH CONTRAST TECHNIQUE: Multidetector CT imaging of the abdomen and pelvis was performed using the standard protocol following bolus administration of intravenous contrast. CONTRAST:  80mL OMNIPAQUE IOHEXOL 300 MG/ML  SOLN COMPARISON:  02/16/2018 FINDINGS: Lower chest: Resolved pleural effusions from prior CT. No acute airspace disease. There are coronary artery calcifications. Hepatobiliary: No focal hepatic lesion. Mild intrahepatic biliary ductal dilatation on the left. Status post cholecystectomy. No biliary dilatation. Pancreas: Mild parenchymal atrophy. No ductal dilatation or inflammation. Spleen: Normal in size without focal abnormality. Adrenals/Urinary Tract: Normal adrenal glands. No hydronephrosis or perinephric edema. Homogeneous renal enhancement with symmetric excretion on delayed phase imaging. Nonobstructing stones in the kidneys versus vascular calcifications. There are small bilateral renal cysts. Urinary bladder is physiologically distended without wall thickening. Stomach/Bowel: Small hiatal hernia. Fluid-filled stomach. Mild pre pyloric gastric wall thickening. No small  bowel dilatation, inflammation, or obstruction. Normal appendix. Moderate volume of stool throughout the colon. Stool distends the rectum. Rectal diameter of 6.3 cm without rectal wall thickening. Vascular/Lymphatic: Moderate aortic and branch atherosclerosis. No aneurysm. No enlarged lymph nodes in the abdomen or pelvis. Reproductive: Prominent prostate gland causes mild mass effect on the bladder base. Other: Fat in the inguinal canals, left greater than right. Subcutaneous densities in the anterior abdominal wall most consistent with medication injection sites No free air, free fluid, or intra-abdominal fluid  collection. Musculoskeletal: There are no acute or suspicious osseous abnormalities. Degenerative disc disease at L4-L5. IMPRESSION: 1. Mild pre pyloric gastric wall thickening, may be gastritis or peptic ulcer disease. 2. Moderate colonic stool burden, suggesting constipation. No bowel obstruction or inflammation. 3. Incidental small hiatal hernia. Aortic Atherosclerosis (ICD10-I70.0). Electronically Signed   By: Narda Rutherford M.D.   On: 04/18/2018 01:39    Pending Labs Unresulted Labs (From admission, onward)    Start     Ordered   04/18/18 0200  Troponin I - Once-Timed  Once-Timed,   STAT     04/18/18 0013          Vitals/Pain Today's Vitals   04/17/18 2315 04/18/18 0000 04/18/18 0015 04/18/18 0200  BP: (!) 168/79 (!) 169/119  (!) 168/81  Pulse: 82 86  89  Resp: (!) 21 13  (!) 23  Temp:      TempSrc:      SpO2: 98% 100%  99%  Weight:      Height:      PainSc:   0-No pain     Isolation Precautions No active isolations  Medications Medications  sodium chloride 0.9 % bolus 1,000 mL (0 mLs Intravenous Stopped 04/18/18 0053)  ondansetron (ZOFRAN) injection 4 mg (4 mg Intravenous Given 04/17/18 2309)  iohexol (OMNIPAQUE) 300 MG/ML solution 100 mL (100 mLs Intravenous Contrast Given 04/18/18 0052)  metoCLOPramide (REGLAN) injection 10 mg (10 mg Intravenous Given 04/18/18 0203)     Mobility walks High fall risk   Focused Assessments   Gastrointestinal WDL N/V 1 day x3 emesis     R Recommendations: See Admitting Provider Note  Report given to:   Additional Notes:

## 2018-04-19 LAB — GLUCOSE, CAPILLARY
Glucose-Capillary: 100 mg/dL — ABNORMAL HIGH (ref 70–99)
Glucose-Capillary: 98 mg/dL (ref 70–99)
Glucose-Capillary: 122 mg/dL — ABNORMAL HIGH (ref 70–99)
Glucose-Capillary: 218 mg/dL — ABNORMAL HIGH (ref 70–99)
Glucose-Capillary: 195 mg/dL — ABNORMAL HIGH (ref 70–99)
Glucose-Capillary: 188 mg/dL — ABNORMAL HIGH (ref 70–99)

## 2018-04-19 LAB — BASIC METABOLIC PANEL
Anion gap: 8 (ref 5–15)
BUN: 17 mg/dL (ref 8–23)
CO2: 28 mmol/L (ref 22–32)
Calcium: 8.6 mg/dL — ABNORMAL LOW (ref 8.9–10.3)
Chloride: 104 mmol/L (ref 98–111)
Creatinine, Ser: 1.28 mg/dL — ABNORMAL HIGH (ref 0.61–1.24)
GFR calc Af Amer: >60 mL/min (ref >60)
GFR calc non Af Amer: 57 mL/min — ABNORMAL LOW (ref >60)
Glucose, Bld: 122 mg/dL — ABNORMAL HIGH (ref 70–99)
Potassium: 4.1 mmol/L (ref 3.5–5.1)
Sodium: 140 mmol/L (ref 135–145)

## 2018-04-19 MED ORDER — MAGNESIUM OXIDE 400 (241.3 MG) MG PO TABS
400.0000 mg | ORAL_TABLET | Freq: Two times a day (BID) | ORAL | Status: DC
  Administered 2018-04-19 – 2018-04-21 (×5): 400 mg via ORAL
  Filled 2018-04-19 (×5): qty 1

## 2018-04-19 MED ORDER — INSULIN ASPART 100 UNIT/ML ~~LOC~~ SOLN: 0.0000 [IU] | Freq: Every day | SUBCUTANEOUS | Status: DC

## 2018-04-19 MED ORDER — INSULIN ASPART 100 UNIT/ML ~~LOC~~ SOLN
0.0000 [IU] | Freq: Three times a day (TID) | SUBCUTANEOUS | Status: DC
  Administered 2018-04-20 (×2): 5 [IU] via SUBCUTANEOUS
  Administered 2018-04-20: 15 [IU] via SUBCUTANEOUS
  Administered 2018-04-21: 11 [IU] via SUBCUTANEOUS
  Administered 2018-04-21: 8 [IU] via SUBCUTANEOUS

## 2018-04-19 NOTE — Progress Notes (Signed)
Page to PT to request eval for d/c

## 2018-04-19 NOTE — Progress Notes (Signed)
Page to MD.    [phone] Pink tele order expired, please re-enter if desired until dc. per CCMD

## 2018-04-19 NOTE — Evaluation (Signed)
Physical Therapy Evaluation Patient Details Name: Philip [name]. MRN: [id number] DOB: [birth year] Today's Date: 04/19/2018   History of Present Illness  68 year old male with history of CAD, IDDM, history of CVA with residual deficits, who is a poor historian presented with nausea vomiting.  In the ER found to have mild AKI with creatinine 1.8 from baseline 0.9 in February, calcium 11.3, was given IV fluids and was admitted for further management. Per family he was vomtting for few days worse last night. Was not eating well few days. He has [facility], told to go to hospital but refused to go to hospital.     Clinical Impression  Pt admitted with above diagnosis. Pt currently with functional limitations due to the deficits listed below (see PT Problem List). PTA pt living with ex wife, reports independence however per RN family has been helping to take care of him. Today ambulating unit and stairs with min A at times for stability due to poor balance.  Pt will benefit from skilled PT to increase their independence and safety with mobility to allow discharge to the venue listed below.       Follow Up Recommendations SNF    Equipment Recommendations  (TBD)    Recommendations for Other Services       Precautions / Restrictions Precautions Precautions: Fall Restrictions Weight Bearing Restrictions: No      Mobility  Bed Mobility Overal bed mobility: Independent                Transfers Overall transfer level: Modified independent                  Ambulation/Gait Ambulation/Gait assistance: Min assist Gait Distance (Feet): 85 Feet Assistive device: Rolling walker (2 wheeled) Gait Pattern/deviations: Step-to pattern Gait velocity: decreased   General Gait Details: pt with poor dynamic balance, obstacale avoidance.   Stairs            Wheelchair Mobility    Modified Rankin (Stroke Patients Only)       Balance Overall balance  assessment: Needs assistance   Sitting balance-Leahy Scale: Good       Standing balance-Leahy Scale: Fair                               Pertinent Vitals/Pain Pain Assessment: No/denies pain    Home Living Family/patient expects to be discharged to:: Skilled nursing facility Living Arrangements: Other (Comment)               Additional Comments: patient lives with his ex wife and grandson     Prior Function Level of Independence: Independent               Hand Dominance   Dominant Hand: Left    Extremity/Trunk Assessment   Upper Extremity Assessment Upper Extremity Assessment: Defer to OT evaluation    Lower Extremity Assessment Lower Extremity Assessment: Overall WFL for tasks assessed       Communication   Communication: Expressive difficulties  Cognition     Overall Cognitive Status: History of cognitive impairments - at baseline                                        General Comments      Exercises     Assessment/Plan    PT Assessment Patient  needs continued PT services  PT Problem List Decreased strength       PT Treatment Interventions DME instruction;Gait training;Stair training;Functional mobility training    PT Goals (Current goals can be found in the Care Plan section)  Acute Rehab PT Goals Patient Stated Goal: non stated PT Goal Formulation: With patient Time For Goal Achievement: 05/03/18 Potential to Achieve Goals: Good    Frequency Min 3X/week   Barriers to discharge Decreased caregiver support      Co-evaluation               AM-PAC PT "6 Clicks" Mobility  Outcome Measure Help needed turning from your back to your side while in a flat bed without using bedrails?: None Help needed moving from lying on your back to sitting on the side of a flat bed without using bedrails?: A Little Help needed moving to and from a bed to a chair (including a wheelchair)?: A Little Help needed  standing up from a chair using your arms (e.g., wheelchair or bedside chair)?: A Little Help needed to walk in hospital room?: A Little Help needed climbing 3-5 steps with a railing? : A Little 6 Click Score: 19    End of Session Equipment Utilized During Treatment: Gait belt Activity Tolerance: Patient tolerated treatment well Patient left: in bed Nurse Communication: Mobility status PT Visit Diagnosis: Unsteadiness on feet (R26.81)    Time: 1415-1440 PT Time Calculation (min) (ACUTE ONLY): 25 min   Charges:   PT Evaluation $PT Eval Low Complexity: 1 Low PT Treatments $Gait Training: 8-22 mins       Etta Grandchild, PT, DPT Acute Rehabilitation Services Pager: [phone] Office: [phone]    Etta Grandchild 04/19/2018, 2:45 PM

## 2018-04-19 NOTE — Evaluation (Signed)
Occupational Therapy Evaluation Patient Details Name: Philip [name]. MRN: [id number] DOB: [birth year] Today's Date: 04/19/2018    History of Present Illness 68 year old male with history of CAD, IDDM, history of CVA with residual deficits, who is a poor historian presented with nausea vomiting.  In the ER found to have mild AKI with creatinine 1.8 from baseline 0.9 in February, calcium 11.3, was given IV fluids and was admitted for further management. Per family he was vomtting for few days worse last night. Was not eating well few days. [facility] has [facility], told to go to hospital but refused to go to hospital.   Clinical Impression   Patient admitted for above and limited by problem list below, including impaired cognition, impaired balance, poor safety awareness, and decreased coordination of R UE.  PTA patient reports independent, but noted poor historian.  Patient currently requires min guard for transfers, min assist for LB ADLs, min assist for UB ADLs, and min guard for grooming standing at sink.  Attempted short blessed test for cognition, but pt unable to complete assessment. Questionable vision, as he requires max verbal cueing to locate sink when standing in room (on R side), attemtped visual assessment but unable to cognitively follow tasks. Patient will benefit from continued OT services while admitted and after dc at SNF level in order to optimize independence and safety with ADLs/ mobility.     Follow Up Recommendations  SNF;Supervision/Assistance - 24 hour    Equipment Recommendations  Other (comment)(TBD at next venue of care)    Recommendations for Other Services       Precautions / Restrictions Precautions Precautions: Fall Restrictions Weight Bearing Restrictions: No      Mobility Bed Mobility Overal bed mobility: Needs Assistance Bed Mobility: Supine to Sit;Sit to Supine     Supine to sit: Supervision Sit to supine: Supervision   General bed  mobility comments: supervision for safety   Transfers Overall transfer level: Needs assistance Equipment used: Rolling walker (2 wheeled) Transfers: Sit to/from Stand Sit to Stand: Min guard         General transfer comment: min guard for safety    Balance Overall balance assessment: Needs assistance Sitting-balance support: No upper extremity supported;Feet supported Sitting balance-Leahy Scale: Good     Standing balance support: No upper extremity supported;During functional activity Standing balance-Leahy Scale: Fair                             ADL either performed or assessed with clinical judgement   ADL Overall ADL's : Needs assistance/impaired     Grooming: Wash/dry hands;Min guard;Standing   Upper Body Bathing: Minimal assistance;Sitting   Lower Body Bathing: Minimal assistance;Sit to/from stand   Upper Body Dressing : Sitting;Minimal assistance   Lower Body Dressing: Minimal assistance;Sit to/from stand Lower Body Dressing Details (indicate cue type and reason): able to don/doff socks with increased effort due to decreased coordination of R UE  Toilet Transfer: Min IT sales professional Details (indicate cue type and reason): simulated in room Toileting- Clothing Manipulation and Hygiene: Min guard;Sit to/from stand       Functional mobility during ADLs: Min guard;Rolling walker;Cueing for safety;Cueing for sequencing General ADL Comments: limited by cognition     Vision   Vision Assessment?: Vision impaired- to be further tested in functional context Additional Comments: difficult to assess due to cognition, requires max cueing to locate sink on R side of visual field  Perception     Praxis      Pertinent Vitals/Pain Pain Assessment: No/denies pain     Hand Dominance Right   Extremity/Trunk Assessment Upper Extremity Assessment Upper Extremity Assessment: RUE deficits/detail;Difficult to assess due to impaired  cognition RUE Deficits / Details: poor coordination and functional use  RUE Sensation: decreased light touch;decreased proprioception RUE Coordination: decreased fine motor;decreased gross motor   Lower Extremity Assessment Lower Extremity Assessment: Defer to PT evaluation       Communication Communication Communication: Expressive difficulties   Cognition Arousal/Alertness: Awake/alert Behavior During Therapy: WFL for tasks assessed/performed Overall Cognitive Status: History of cognitive impairments - at baseline Area of Impairment: Attention;Orientation;Memory;Following commands;Safety/judgement;Awareness;Problem solving                 Orientation Level: Disoriented to;Time;Situation Current Attention Level: Sustained Memory: Decreased recall of precautions;Decreased short-term memory Following Commands: Follows one step commands inconsistently;Follows one step commands with increased time Safety/Judgement: Decreased awareness of safety;Decreased awareness of deficits Awareness: Intellectual Problem Solving: Slow processing;Decreased initiation;Difficulty sequencing;Requires verbal cues;Requires tactile cues General Comments: max multimodal cueing to complete basic self care tasks, poor recall of tasks asked to complete   General Comments       Exercises     Shoulder Instructions      Home Living Family/patient expects to be discharged to:: Skilled nursing facility Living Arrangements: Other (Comment)                               Additional Comments: patient lives with his ex wife and grandson       Prior Functioning/Environment Level of Independence: Independent        Comments: reports independent, poor historian         OT Problem List: Decreased strength;Decreased activity tolerance;Impaired balance (sitting and/or standing);Impaired vision/perception;Decreased coordination;Decreased cognition;Decreased safety awareness;Decreased  knowledge of use of DME or AE;Impaired UE functional use      OT Treatment/Interventions: Self-care/ADL training;Therapeutic activities;Cognitive remediation/compensation;Patient/family education;Balance training;DME and/or AE instruction;Neuromuscular education;Visual/perceptual remediation/compensation    OT Goals(Current goals can be found in the care plan section) Acute Rehab OT Goals Patient Stated Goal: none stated Time For Goal Achievement: 05/03/18 Potential to Achieve Goals: Good  OT Frequency: Min 2X/week   Barriers to D/C:            Co-evaluation              AM-PAC OT "6 Clicks" Daily Activity     Outcome Measure Help from another person eating meals?: A Little Help from another person taking care of personal grooming?: A Little Help from another person toileting, which includes using toliet, bedpan, or urinal?: A Little Help from another person bathing (including washing, rinsing, drying)?: A Little Help from another person to put on and taking off regular upper body clothing?: A Little Help from another person to put on and taking off regular lower body clothing?: A Little 6 Click Score: 18   End of Session Equipment Utilized During Treatment: Rolling walker Nurse Communication: Mobility status  Activity Tolerance: Patient tolerated treatment well Patient left: in bed;with call bell/phone within reach;with bed alarm set  OT Visit Diagnosis: Other abnormalities of gait and mobility (R26.89);Muscle weakness (generalized) (M62.81);Other symptoms and signs involving cognitive function                Time: 5621-3086 OT Time Calculation (min): 14 min Charges:  OT General Charges $OT Visit: 1 Visit OT Evaluation $  OT Eval Moderate Complexity: 1 Mod  Chancy Milroyhristie S Baldemar Dady, [place] Acute Rehabilitation Services Pager [phone] Office [phone]    Chancy MilroyChristie S Emilija Bohman 04/19/2018, 4:12 PM

## 2018-04-19 NOTE — Progress Notes (Signed)
Call received from Gould, patient's daughter. She was provided an update on the careplan for patient; gearing toward d/c to facility pending PT eval. Cynthia's information in chart was confirmed, mobile number listed is technically patient's s/o cell phone number. Home number listed for Aram Beecham is actually her mobile number.  Aram Beecham requests updates as they are available, once placement decisions are determined.

## 2018-04-19 NOTE — Progress Notes (Signed)
Patient resting comfortably during shift report. Denies complaints. Pt appears to be describing his 'confusion' as expressive aphasia, reporting MI and CVA in the past. Pt states this began when he was hospitalized for MI recently, but feels like its worse at times.

## 2018-04-19 NOTE — Progress Notes (Signed)
PROGRESS NOTE    Philip [name].  EHM:[id number] DOB: 05/09/1950 DOA: 04/17/2018 PCP: System, Pcp Not In   Brief Narrative: 68 year old male with history of CAD, IDDM, history of CVA with residual deficits, who is a poor historian presented with nausea vomiting.  In the ER found to have mild AKI with creatinine 1.8 from baseline 0.9 in February, calcium 11.3, was given IV fluids and was admitted for further management. Per family he was vomtting for few days worse last night. Was not eating well few days. He has [facility], told to go to hospital but refused to go to hospital. He was seen in ER, found to have AKI and was admitted wit iv saline. AKI improving nicely With hydration.  At this time patient is medically stable.  PT OT SW consulted for placement after discussion with patient's family.  Subjective: Resting well. With baseline expressive aphasia/confusion, follows commands appropriately.  Denies nausea vomiting chest pain.  Assessment & Plan:   Principal Problem:   AKI (acute kidney injury) (HCC) Active Problems:   CAD (coronary artery disease) of artery bypass graft   Nausea & vomiting   History of CVA with residual deficit   Insulin-requiring or dependent type II diabetes mellitus (HCC)   Hypercalcemia   AKI,Suspect prerenal from volume depletion with nausea vomiting.  Creatinine has improved nicely as below with IV fluid hydration.  Encourage oral intake, continue gentle fluids.   Recent Labs  Lab 04/17/18 2303 04/18/18 0342 04/19/18 0503  CREATININE 1.82* 1.72* 1.28*   Nausea vomiting: No recurrence.  Continue diet, supportive care PPI antiemetics.   Mild hypercalcemia secondary to multiple repletion, calcium improved.   CAD/ Positive troponin, flat 0.04, 0.06, without chest pain or acute EKG changes.Continue aspirin, Plavix statin and beta-blocker history of CAD.  History of CVA/memory issues/poor historian/expressive aphasia: I called his  contact-ex-wife who lives with him and has grandson who is 70 yo who essentially takes care of him. They live in house with owner who has medicla issues, they feel he may need to go to NH.He has memory issues and progressively worse since his stroke.He is at baseline irate, curses all family, and mentally was getting worse. He was in Rehab recently. Consulted SW ,PT/OT and anticipating placement.    IDDM: HBA1C AT 7.7 02/19/18, blood sugar well controlled,Continue sliding scale insulin  Hypomagnesemia was repleted.  Add oral mag oxide.  DVT prophylaxis: sq Heparin Code Status: full Family Communication: updated ex-wife over phone Disposition Plan: remains inpatient pending clinical improvement. And placement.  Consultants:  None  Procedures:  Antimicrobials: Anti-infectives (From admission, onward)   None       Objective: Vitals:   04/18/18 1932 04/19/18 0009 04/19/18 0359 04/19/18 0906  BP: 120/67 118/75 117/77 (!) 156/81  Pulse: 67 67 67 67  Resp: 16 20 20    Temp: 98.4 F (36.9 C) 98.2 F (36.8 C) 98.1 F (36.7 C)   TempSrc: Oral Oral Oral   SpO2: 97% 97% 100%   Weight:   72.4 kg   Height:        Intake/Output Summary (Last 24 hours) at 04/19/2018 1213 Last data filed at 04/19/2018 1058 Gross per 24 hour  Intake 2017.83 ml  Output 2050 ml  Net -32.17 ml   Filed Weights   04/17/18 2259 04/18/18 0320 04/19/18 0359  Weight: 81.6 kg 71.4 kg 72.4 kg   Weight change: -9.208 kg  Body mass index is 25.01 kg/m.  Intake/Output from previous day: 04/11  0701 - 04/12 0700 In: 2087 [P.O.:887; I.V.:1200] Out: 1450 [Urine:1450] Intake/Output this shift: Total I/O In: 410.8 [P.O.:240; I.V.:170.8] Out: 600 [Urine:600]  Examination:  General exam: Appears calm and comfortable,Not in distress, older fore the age HEENT:PERRL,Oral mucosa moist, Ear/Nose normal on gross exam Respiratory system: Bilateral equal air entry, normal vesicular breath sounds, no wheezes or  crackles  Cardiovascular system: S1 & S2 heard,No JVD, murmurs. Gastrointestinal system: Abdomen is  soft, non tender, non distended, BS +  Nervous System:Alert and oriented. No focal neurological deficits/moving extremities, sensation intact. Extremities: No edema, no clubbing, distal peripheral pulses palpable. Skin: No rashes, lesions, no icterus MSK: Normal muscle bulk,tone ,power  Medications:  Scheduled Meds: . aspirin EC  81 mg Oral Daily  . atorvastatin  80 mg Oral q1800  . citalopram  20 mg Oral Daily  . clopidogrel  75 mg Oral Daily  . feeding supplement (ENSURE ENLIVE)  237 mL Oral BID BM  . heparin  5,000 Units Subcutaneous Q8H  . insulin aspart  0-15 Units Subcutaneous Q4H  . isosorbide mononitrate  30 mg Oral Daily  . metoprolol tartrate  37.5 mg Oral BID  . pantoprazole (PROTONIX) IV  40 mg Intravenous Q24H  . pneumococcal 23 valent vaccine  0.5 mL Intramuscular Tomorrow-1000  . sodium chloride flush  3 mL Intravenous Q12H   Continuous Infusions:  Data Reviewed: I have personally reviewed following labs and imaging studies  CBC: Recent Labs  Lab 04/17/18 2303 04/18/18 0342  WBC 9.6 9.3  NEUTROABS 7.9* 7.9*  HGB 14.4 13.5  HCT 44.0 40.6  MCV 90.0 89.2  PLT 208 171   Basic Metabolic Panel: Recent Labs  Lab 04/17/18 2303 04/18/18 0342 04/19/18 0503  NA 138 138 140  K 4.9 5.1 4.1  CL 95* 97* 104  CO2 29 26 28   GLUCOSE 168* 194* 122*  BUN 29* 27* 17  CREATININE 1.82* 1.72* 1.28*  CALCIUM 11.3* 10.1 8.6*  MG  --  1.2*  --    GFR: Estimated Creatinine Clearance: 50.9 mL/min (A) (by C-G formula based on SCr of 1.28 mg/dL (H)). Liver Function Tests: Recent Labs  Lab 04/17/18 2303 04/18/18 0342  AST 30 23  ALT 48* 41  ALKPHOS 74 71  BILITOT 0.9 1.1  PROT 7.3 6.3*  ALBUMIN 4.1 3.6   Recent Labs  Lab 04/17/18 2303  LIPASE 22   No results for input(s): AMMONIA in the last 168 hours. Coagulation Profile: No results for input(s): INR,  PROTIME in the last 168 hours. Cardiac Enzymes: Recent Labs  Lab 04/17/18 2303 04/18/18 0159 04/18/18 0342  TROPONINI 0.03* 0.04* 0.06*   BNP (last 3 results) No results for input(s): PROBNP in the last 8760 hours. HbA1C: No results for input(s): HGBA1C in the last 72 hours. CBG: Recent Labs  Lab 04/18/18 2350 04/19/18 0131 04/19/18 0350 04/19/18 0731 04/19/18 1210  GLUCAP 151* 100* 98 122* 218*   Lipid Profile: No results for input(s): CHOL, HDL, LDLCALC, TRIG, CHOLHDL, LDLDIRECT in the last 72 hours. Thyroid Function Tests: No results for input(s): TSH, T4TOTAL, FREET4, T3FREE, THYROIDAB in the last 72 hours. Anemia Panel: No results for input(s): VITAMINB12, FOLATE, FERRITIN, TIBC, IRON, RETICCTPCT in the last 72 hours. Sepsis Labs: No results for input(s): PROCALCITON, LATICACIDVEN in the last 168 hours.  Recent Results (from the past 240 hour(s))  MRSA PCR Screening     Status: None   Collection Time: 04/18/18  4:00 AM  Result Value Ref Range Status  MRSA by PCR NEGATIVE NEGATIVE Final    Comment:        The GeneXpert MRSA Assay (FDA approved for NASAL specimens only), is one component of a comprehensive MRSA colonization surveillance program. It is not intended to diagnose MRSA infection nor to guide or monitor treatment for MRSA infections. Performed at [facility], [address], [place], [place] [address]       Radiology Studies: Ct Abdomen Pelvis W Contrast  Result Date: 04/18/2018 CLINICAL DATA:  Nausea and vomiting. EXAM: CT ABDOMEN AND PELVIS WITH CONTRAST TECHNIQUE: Multidetector CT imaging of the abdomen and pelvis was performed using the standard protocol following bolus administration of intravenous contrast. CONTRAST:  80mL OMNIPAQUE IOHEXOL 300 MG/ML  SOLN COMPARISON:  02/16/2018 FINDINGS: Lower chest: Resolved pleural effusions from prior CT. No acute airspace disease. There are coronary artery calcifications. Hepatobiliary: No focal  hepatic lesion. Mild intrahepatic biliary ductal dilatation on the left. Status post cholecystectomy. No biliary dilatation. Pancreas: Mild parenchymal atrophy. No ductal dilatation or inflammation. Spleen: Normal in size without focal abnormality. Adrenals/Urinary Tract: Normal adrenal glands. No hydronephrosis or perinephric edema. Homogeneous renal enhancement with symmetric excretion on delayed phase imaging. Nonobstructing stones in the kidneys versus vascular calcifications. There are small bilateral renal cysts. Urinary bladder is physiologically distended without wall thickening. Stomach/Bowel: Small hiatal hernia. Fluid-filled stomach. Mild pre pyloric gastric wall thickening. No small bowel dilatation, inflammation, or obstruction. Normal appendix. Moderate volume of stool throughout the colon. Stool distends the rectum. Rectal diameter of 6.3 cm without rectal wall thickening. Vascular/Lymphatic: Moderate aortic and branch atherosclerosis. No aneurysm. No enlarged lymph nodes in the abdomen or pelvis. Reproductive: Prominent prostate gland causes mild mass effect on the bladder base. Other: Fat in the inguinal canals, left greater than right. Subcutaneous densities in the anterior abdominal wall most consistent with medication injection sites No free air, free fluid, or intra-abdominal fluid collection. Musculoskeletal: There are no acute or suspicious osseous abnormalities. Degenerative disc disease at L4-L5. IMPRESSION: 1. Mild pre pyloric gastric wall thickening, may be gastritis or peptic ulcer disease. 2. Moderate colonic stool burden, suggesting constipation. No bowel obstruction or inflammation. 3. Incidental small hiatal hernia. Aortic Atherosclerosis (ICD10-I70.0). Electronically Signed   By: Narda Rutherford M.D.   On: 04/18/2018 01:39      LOS: 1 day   Time spent: More than 50% of that time was spent in counseling and/or coordination of care.  Lanae Boast, MD [facility]   04/19/2018, 12:13 PM

## 2018-04-20 LAB — GLUCOSE, CAPILLARY
Glucose-Capillary: 244 mg/dL — ABNORMAL HIGH (ref 70–99)
Glucose-Capillary: 236 mg/dL — ABNORMAL HIGH (ref 70–99)
Glucose-Capillary: 364 mg/dL — ABNORMAL HIGH (ref 70–99)
Glucose-Capillary: 189 mg/dL — ABNORMAL HIGH (ref 70–99)

## 2018-04-20 NOTE — NC FL2 (Signed)
[place] MEDICAID FL2 LEVEL OF CARE SCREENING TOOL     IDENTIFICATION  Patient Name: Philip [name]. Birthdate: 02-22-1950 Sex: male Admission Date (Current Location): 04/17/2018  [facility] and [place] Number:  Producer, television/film/video and Address:  [facility] [place], [address] [place], [place] [address]      Provider Number: [id number]  Attending Physician Name and Address:  Lanae Boast, MD  Relative Name and Phone Number:  [facility], Son, [phone]    Current Level of Care: Hospital Recommended Level of Care: Skilled Nursing Facility Prior Approval Number:    Date Approved/Denied: 02/20/18 PASRR Number: [id number]  Discharge Plan: SNF    Current Diagnoses: Patient Active Problem List   Diagnosis Date Noted  . AKI (acute kidney injury) (HCC) 04/18/2018  . History of CVA with residual deficit 04/18/2018  . Insulin-requiring or dependent type II diabetes mellitus (HCC) 04/18/2018  . Hypercalcemia 04/18/2018  . Hypoxemia   . Dehydration   . Nausea & vomiting   . Weight loss 10/19/2016  . Essential hypertension 10/19/2016  . CAD (coronary artery disease) of artery bypass graft 10/19/2016  . GERD (gastroesophageal reflux disease) 10/19/2016    Orientation RESPIRATION BLADDER Height & Weight     Self, Place  Normal Continent Weight: 161 lb 6.4 oz (73.2 kg)(c scale) Height:  5\' 7"  (170.2 cm)  BEHAVIORAL SYMPTOMS/MOOD NEUROLOGICAL BOWEL NUTRITION STATUS      Continent Diet(low sodium/heart healthy, thin liquids)  AMBULATORY STATUS COMMUNICATION OF NEEDS Skin   Limited Assist Verbally Other (Comment)(skin excoriation on arm/leg)                       Personal Care Assistance Level of Assistance  Bathing, Feeding, Dressing, Total care Bathing Assistance: Limited assistance Feeding assistance: Independent Dressing Assistance: Limited assistance Total Care Assistance: Limited assistance   Functional Limitations Info  Sight,  Hearing, Speech Sight Info: Impaired Hearing Info: Adequate Speech Info: Adequate    SPECIAL CARE FACTORS FREQUENCY  PT (By licensed PT), OT (By licensed OT)     PT Frequency: 5x/wk OT Frequency: 5x/wk            Contractures Contractures Info: Not present    Additional Factors Info  Code Status, Allergies, Insulin Sliding Scale Code Status Info: Full Code Allergies Info: Codeine   Insulin Sliding Scale Info: insulin aspart novolog 0-15 units 3x daily w/meals and insulin aspart novolog 0-5 units daily at bedtime       Current Medications (04/20/2018):  This is the current hospital active medication list Current Facility-Administered Medications  Medication Dose Route Frequency Provider Last Rate Last Dose  . acetaminophen (TYLENOL) tablet 650 mg  650 mg Oral Q6H PRN Opyd, Lavone Neri, MD       Or  . acetaminophen (TYLENOL) suppository 650 mg  650 mg Rectal Q6H PRN Opyd, Lavone Neri, MD      . aspirin EC tablet 81 mg  81 mg Oral Daily Opyd, Lavone Neri, MD   81 mg at 04/20/18 0841  . atorvastatin (LIPITOR) tablet 80 mg  80 mg Oral q1800 Briscoe Deutscher, MD   80 mg at 04/19/18 1724  . bisacodyl (DULCOLAX) EC tablet 5 mg  5 mg Oral Daily PRN Opyd, Lavone Neri, MD      . citalopram (CELEXA) tablet 20 mg  20 mg Oral Daily Opyd, Lavone Neri, MD   20 mg at 04/20/18 0841  . clopidogrel (PLAVIX) tablet 75 mg  75 mg Oral Daily Opyd, Lavone Neriimothy S, MD   75 mg at 04/20/18 0841  . feeding supplement (ENSURE ENLIVE) (ENSURE ENLIVE) liquid 237 mL  237 mL Oral BID BM Kc, Ramesh, MD   237 mL at 04/20/18 1202  . heparin injection 5,000 Units  5,000 Units Subcutaneous Q8H Opyd, Lavone Neriimothy S, MD   5,000 Units at 04/20/18 0534  . hydrALAZINE (APRESOLINE) injection 10 mg  10 mg Intravenous Q4H PRN Opyd, Lavone Neriimothy S, MD      . HYDROcodone-acetaminophen (NORCO/VICODIN) 5-325 MG per tablet 1 tablet  1 tablet Oral Q4H PRN Opyd, Lavone Neriimothy S, MD      . insulin aspart (novoLOG) injection 0-15 Units  0-15 Units Subcutaneous  TID WC Bodenheimer, Charles A, NP   5 Units at 04/20/18 1203  . insulin aspart (novoLOG) injection 0-5 Units  0-5 Units Subcutaneous QHS Bodenheimer, Charles A, NP      . isosorbide mononitrate (IMDUR) 24 hr tablet 30 mg  30 mg Oral Daily Opyd, Lavone Neriimothy S, MD   30 mg at 04/20/18 0841  . magnesium oxide (MAG-OX) tablet 400 mg  400 mg Oral BID Lanae BoastKc, Ramesh, MD   400 mg at 04/20/18 0841  . metoprolol tartrate (LOPRESSOR) tablet 37.5 mg  37.5 mg Oral BID Opyd, Lavone Neriimothy S, MD   37.5 mg at 04/20/18 0841  . ondansetron (ZOFRAN) tablet 4 mg  4 mg Oral Q6H PRN Opyd, Lavone Neriimothy S, MD       Or  . ondansetron (ZOFRAN) injection 4 mg  4 mg Intravenous Q6H PRN Opyd, Lavone Neriimothy S, MD      . pantoprazole (PROTONIX) injection 40 mg  40 mg Intravenous Q24H Kc, Ramesh, MD   40 mg at 04/19/18 1724  . pneumococcal 23 valent vaccine (PNU-IMMUNE) injection 0.5 mL  0.5 mL Intramuscular Tomorrow-1000 Opyd, Lavone Neriimothy S, MD      . polyethylene glycol (MIRALAX / GLYCOLAX) packet 17 g  17 g Oral Daily PRN Opyd, Lavone Neriimothy S, MD      . promethazine (PHENERGAN) injection 12.5 mg  12.5 mg Intravenous Q6H PRN Opyd, Lavone Neriimothy S, MD      . sodium chloride flush (NS) 0.9 % injection 3 mL  3 mL Intravenous Q12H Opyd, Lavone Neriimothy S, MD   3 mL at 04/20/18 [id number]     Discharge Medications: Please see discharge summary for a list of discharge medications.  Relevant Imaging Results:  Relevant Lab Results:   Additional Information SSN: [id number]  Nada BoozerCaitlin B Karmela Bram, LCSWA

## 2018-04-20 NOTE — TOC Initial Note (Signed)
Transition of Care (TOC) - Initial/Assessment Note    Patient Details  Name: Philip RidgeHelmut R Ging [name] MRN: [id number] Date of Birth: Nov 15, 1950  Transition of Care [facility](TOC) CM/SW Contact:    Gwenlyn Fudgeaitlin B Cyrena Kuchenbecker, LCSWA Phone Number: 04/20/2018, 2:10 PM  Clinical Narrative:        CSW attempted to call the patient's daughter, Philip CooksCynthia [name]. The number that is in the chart no longer works. CSW called the other number, Philip [name], patient's ex-wife. She stated that her daughter does not currently have a phone. She stated that her ex-husband cannot come back to their home. She and her roommate cannot take care of him and he is not taking care of himself. She stated that only his grandson is able to do things for him and he is 15 and cannot take care of his grandfather all of the time. She stated that he needed to go to assisted living facility. CSW stated that she would need her daughter to make the medical decision since she was next of kin. She stated that her daughter does not live with her. She stated that her daughter is not in a good place and wouldn't make the best decision for her father. Philip [name] provided the phone number for her step-son, the patient's eldest son. His name is Tourist information centre managerKevin [name].   CSW called Caryn BeeKevin [name]. CSW introduced herself and explained her role. He stated that he is in agreement with SNF. He stated that [facility] would be the first preference. He stated that he and his father had not spoken in a few years. He stated that they have spoken more since he had been in rehab back in February. He stated that he felt comfortable making medical decisions for his father. CSW obtained permission to fax patient out to other facilities in the area. CSW completed readmission risk.              Expected Discharge Plan: Skilled Nursing Facility Barriers to Discharge: Continued Medical Work up, English as a second language teachernsurance Authorization   Patient Goals and CMS Choice Patient states their goals for this hospitalization  and ongoing recovery are:: Pt son wants his father to get better CMS Medicare.gov Compare Post Acute Care list provided to:: Patient Represenative (must comment)(Pt son, Philip [name]) Choice offered to / list presented to : Adult Children  Expected Discharge Plan and Services Expected Discharge Plan: Skilled Nursing Facility In-house Referral: Clinical Social Work Discharge Planning Services: NA Post Acute Care Choice: NA Living arrangements for the past 2 months: Single Family Home, Skilled Nursing Facility Expected Discharge Date: 04/20/18               DME Arranged: N/A DME Agency: NA HH Arranged: NA HH Agency: NA  Prior Living Arrangements/Services Living arrangements for the past 2 months: Single Family Home, Skilled Nursing Facility Lives with:: Self, Other (Comment), Roommate(Lives with ex-wife and roommate) Patient language and need for interpreter reviewed:: No Do you feel safe going back to the place where you live?: Yes(Pt can no longer take care of himself at home)      Need for Family Participation in Patient Care: Yes (Comment)(Pt is orient x2) Care giver support system in place?: Yes (comment)   Criminal Activity/Legal Involvement Pertinent to Current Situation/Hospitalization: No - Comment as needed  Activities of Daily Living Home Assistive Devices/Equipment: None ADL Screening (condition at time of admission) Patient's cognitive ability adequate to safely complete daily activities?: Yes Is the patient deaf or have difficulty hearing?: No Does the patient  have difficulty seeing, even when wearing glasses/contacts?: No Does the patient have difficulty concentrating, remembering, or making decisions?: No Patient able to express need for assistance with ADLs?: Yes Does the patient have difficulty dressing or bathing?: No Independently performs ADLs?: Yes (appropriate for developmental age) Does the patient have difficulty walking or climbing stairs?: No Weakness of  Legs: None Weakness of Arms/Hands: None  Permission Sought/Granted Permission sought to share information with : Oceanographer granted to share information with : Yes, Verbal Permission Granted  Share Information with NAME: Philip [name]  Permission granted to share info w AGENCY: All SNF's  Permission granted to share info w Relationship: Son     Emotional Assessment Appearance:: Appears stated age Attitude/Demeanor/Rapport: Unable to Assess Affect (typically observed): Unable to Assess Orientation: : Oriented to Self, Oriented to Place Alcohol / Substance Use: Not Applicable Psych Involvement: No (comment)  Admission diagnosis:  Hypercalcemia [E83.52] Acute kidney injury (nontraumatic) (HCC) [N17.9] Constipation, unspecified constipation type [K59.00] Non-intractable vomiting with nausea, unspecified vomiting type [R11.2] Patient Active Problem List   Diagnosis Date Noted  . AKI (acute kidney injury) (HCC) 04/18/2018  . History of CVA with residual deficit 04/18/2018  . Insulin-requiring or dependent type II diabetes mellitus (HCC) 04/18/2018  . Hypercalcemia 04/18/2018  . Hypoxemia   . Dehydration   . Nausea & vomiting   . Weight loss 10/19/2016  . Essential hypertension 10/19/2016  . CAD (coronary artery disease) of artery bypass graft 10/19/2016  . GERD (gastroesophageal reflux disease) 10/19/2016   PCP:  System, Pcp Not In Pharmacy:   [id number] [place], [place] - [address] [place] [address] Phone: [phone] Fax: [phone]     Social Determinants of Health (SDOH) Interventions    Readmission Risk Interventions Readmission Risk Prevention Plan 04/20/2018  Transportation Screening Complete  PCP or Specialist Appt within 5-7 Days Complete  Home Care Screening Complete  Medication Review (RN CM) Complete  Some recent data might be hidden

## 2018-04-20 NOTE — Progress Notes (Signed)
PROGRESS NOTE    Philip [name].  WUJ:[id number] DOB: 1950-10-18 DOA: 04/17/2018 PCP: System, Pcp Not In   Brief Narrative: 68 year old male with history of CAD, IDDM, history of CVA with residual deficits, who is a poor historian presented with nausea vomiting.  In the ER found to have mild AKI with creatinine 1.8 from baseline 0.9 in February, calcium 11.3, was given IV fluids and was admitted for further management. Per family he was vomtting for few days worse last night. Was not eating well few days. He has [facility], told to go to hospital but refused to go to hospital. He was seen in ER, found to have AKI and was admitted wit iv saline. AKI improving nicely With hydration.  At this time patient is medically stable.  PT OT SW consulted for placement after discussion with patient's family. At this time patient is clinically stabilized and improved and plan for SNF  Subjective: Resting comfortably no new complaints.  Baseline confusion/expressive aphasia.  tolerating diet.  Assessment & Plan:   Principal Problem:   AKI (acute kidney injury) (HCC) Active Problems:   CAD (coronary artery disease) of artery bypass graft   Nausea & vomiting   History of CVA with residual deficit   Insulin-requiring or dependent type II diabetes mellitus (HCC)   Hypercalcemia   AKI,Suspect prerenal from volume depletion with nausea vomiting.  Creatinine has improved nicely as below with IV fluid hydration.  Stopped IV fluids and encourage oral intake. Creatinine trend as below. Recent Labs  Lab 04/17/18 2303 04/18/18 0342 04/19/18 0503  CREATININE 1.82* 1.72* 1.28*   Nausea vomiting: Resolved, tolerating diet.  Continue supportive care, PPI and antiemetics as needed.   Mild hypercalcemia secondary to multiple repletion, calcium improved.   CAD/ Positive troponin, flat 0.04, 0.06, without chest pain or acute EKG changes. Continue aspirin, Plavix statin and beta-blocker history  of CAD.  History of CVA/memory issues/poor historian/expressive aphasia: I called his contact-ex-wife who lives with him and has grandson who is 74 yo who essentially takes care of him. They live in house with owner who has medicla issues, they feel he may need to go to NH.He has memory issues and progressively worse since his stroke.He is at baseline irate, curses all family, and mentally was getting worse. He was in Rehab recently. Consulted SW ,PT/OT and plan for skilled nursing facility placement.  ? may Need need long-term placement   IDDM: HBA1C AT 7.7 02/19/18, blood sugar well controlled cont ssi while inhouse  Hypomagnesemia was repleted.  on oral mag oxide.  DVT prophylaxis: sq Heparin Code Status: full Family Communication: updated ex-wife over phone and for SNF Disposition Plan: SNF   Consultants:  None  Procedures:  Antimicrobials: Anti-infectives (From admission, onward)   None       Objective: Vitals:   04/19/18 2009 04/20/18 0509 04/20/18 0513 04/20/18 0840  BP: (!) 146/75 137/69  140/78  Pulse: 68 68  66  Resp: Temp: 98.4 F (36.9 C) 98.1 F (36.7 C)  98.4 F (36.9 C)  TempSrc: Oral Oral  Oral  SpO2: 97% 98%  97%  Weight:   73.2 kg   Height:        Intake/Output Summary (Last 24 hours) at 04/20/2018 1127 Last data filed at 04/20/2018 1058 Gross per 24 hour  Intake 770 ml  Output 710 ml  Net 60 ml   Filed Weights   04/18/18 0320 04/19/18 0359 04/20/18 9562  Weight: 71.4 kg 72.4 kg 73.2 kg   Weight change: 0.771 kg  Body mass index is 25.28 kg/m.  Intake/Output from previous day: 04/12 0701 - 04/13 0700 In: 700.8 [P.O.:530; I.V.:170.8] Out: 900 [Urine:900] Intake/Output this shift: Total I/O In: 480 [P.O.:480] Out: 410 [Urine:410]  Examination:  General exam: Appears calm and comfortable,Not in distress, older fore the age HEENT:PERRL,Oral mucosa moist, Ear/Nose normal on gross exam Respiratory system: Bilateral equal air  entry, normal vesicular breath sounds, no wheezes or crackles  Cardiovascular system: S1 & S2 heard,No JVD, murmurs. Gastrointestinal system: Abdomen is  soft, non tender, non distended, BS +  Nervous System:Alert and oriented. No focal neurological deficits/moving extremities, sensation intact. Extremities: No edema, no clubbing, distal peripheral pulses palpable. Skin: No rashes, lesions, no icterus MSK: Normal muscle bulk,tone ,power  Medications:  Scheduled Meds: . aspirin EC  81 mg Oral Daily  . atorvastatin  80 mg Oral q1800  . citalopram  20 mg Oral Daily  . clopidogrel  75 mg Oral Daily  . feeding supplement (ENSURE ENLIVE)  237 mL Oral BID BM  . heparin  5,000 Units Subcutaneous Q8H  . insulin aspart  0-15 Units Subcutaneous TID WC  . insulin aspart  0-5 Units Subcutaneous QHS  . isosorbide mononitrate  30 mg Oral Daily  . magnesium oxide  400 mg Oral BID  . metoprolol tartrate  37.5 mg Oral BID  . pantoprazole (PROTONIX) IV  40 mg Intravenous Q24H  . pneumococcal 23 valent vaccine  0.5 mL Intramuscular Tomorrow-1000  . sodium chloride flush  3 mL Intravenous Q12H   Continuous Infusions:  Data Reviewed: I have personally reviewed following labs and imaging studies  CBC: Recent Labs  Lab 04/17/18 2303 04/18/18 0342  WBC 9.6 9.3  NEUTROABS 7.9* 7.9*  HGB 14.4 13.5  HCT 44.0 40.6  MCV 90.0 89.2  PLT 208 171   Basic Metabolic Panel: Recent Labs  Lab 04/17/18 2303 04/18/18 0342 04/19/18 0503  NA 138 138 140  K 4.9 5.1 4.1  CL 95* 97* 104  CO2 29 26 28   GLUCOSE 168* 194* 122*  BUN 29* 27* 17  CREATININE 1.82* 1.72* 1.28*  CALCIUM 11.3* 10.1 8.6*  MG  --  1.2*  --    GFR: Estimated Creatinine Clearance: 50.9 mL/min (A) (by C-G formula based on SCr of 1.28 mg/dL (H)). Liver Function Tests: Recent Labs  Lab 04/17/18 2303 04/18/18 0342  AST 30 23  ALT 48* 41  ALKPHOS 74 71  BILITOT 0.9 1.1  PROT 7.3 6.3*  ALBUMIN 4.1 3.6   Recent Labs  Lab  04/17/18 2303  LIPASE 22   No results for input(s): AMMONIA in the last 168 hours. Coagulation Profile: No results for input(s): INR, PROTIME in the last 168 hours. Cardiac Enzymes: Recent Labs  Lab 04/17/18 2303 04/18/18 0159 04/18/18 0342  TROPONINI 0.03* 0.04* 0.06*   BNP (last 3 results) No results for input(s): PROBNP in the last 8760 hours. HbA1C: No results for input(s): HGBA1C in the last 72 hours. CBG: Recent Labs  Lab 04/19/18 0731 04/19/18 1210 04/19/18 1647 04/19/18 2005 04/20/18 0555  GLUCAP 122* 218* 195* 188* 244*   Lipid Profile: No results for input(s): CHOL, HDL, LDLCALC, TRIG, CHOLHDL, LDLDIRECT in the last 72 hours. Thyroid Function Tests: No results for input(s): TSH, T4TOTAL, FREET4, T3FREE, THYROIDAB in the last 72 hours. Anemia Panel: No results for input(s): VITAMINB12, FOLATE, FERRITIN, TIBC, IRON, RETICCTPCT in the last 72 hours. Sepsis Labs: No  results for input(s): PROCALCITON, LATICACIDVEN in the last 168 hours.  Recent Results (from the past 240 hour(s))  MRSA PCR Screening     Status: None   Collection Time: 04/18/18  4:00 AM  Result Value Ref Range Status   MRSA by PCR NEGATIVE NEGATIVE Final    Comment:        The GeneXpert MRSA Assay (FDA approved for NASAL specimens only), is one component of a comprehensive MRSA colonization surveillance program. It is not intended to diagnose MRSA infection nor to guide or monitor treatment for MRSA infections. Performed at [facility], [address] [place], [place] [address]       Radiology Studies: No results found.    LOS: 2 days   Time spent: More than 50% of that time was spent in counseling and/or coordination of care.  Lanae Boastamesh Janyth Riera, MD [facility]  04/20/2018, 11:27 AM

## 2018-04-20 NOTE — Progress Notes (Signed)
Pt has 189 blood sugar. Not transferred to epic. Pt was not covered by insulin.

## 2018-04-20 NOTE — TOC Progression Note (Signed)
Transition of Care (TOC) - Progression Note    Patient Details  Name: Philip [name]. MRN: [id number] Date of Birth: 1950/08/05  Transition of Care [facility]) CM/SW Contact  Margarito Liner, LCSW Phone Number: 04/20/2018, 4:28 PM  Clinical Narrative:  [facility] patient [facility]. Paged MD to notify. Tried calling son. No answer and voicemail is full.  Expected Discharge Plan: Skilled Nursing Facility Barriers to Discharge: Continued Medical Work up, English as a second language teacher  Expected Discharge Plan and Services Expected Discharge Plan: Skilled Nursing Facility In-house Referral: Clinical Social Work Discharge Planning Services: NA Post Acute Care Choice: NA Living arrangements for the past 2 months: Single Family Home, Skilled Nursing Facility Expected Discharge Date: 04/20/18               DME Arranged: N/A DME Agency: NA HH Arranged: NA HH Agency: NA   Social Determinants of Health (SDOH) Interventions    Readmission Risk Interventions Readmission Risk Prevention Plan 04/20/2018  Transportation Screening Complete  PCP or Specialist Appt within 5-7 Days Complete  Home Care Screening Complete  Medication Review (RN CM) Complete  Some recent data might be hidden

## 2018-04-20 NOTE — Progress Notes (Signed)
Physical Therapy Treatment Patient Details Name: Philip [name]. MRN: [id number] DOB: 22-Jan-1950 Today's Date: 04/20/2018    History of Present Illness 68 year old male with history of CAD, IDDM, history of CVA with residual deficits, who is a poor historian presented with nausea vomiting.  In the ER found to have mild AKI with creatinine 1.8 from baseline 0.9 in February, calcium 11.3, was given IV fluids and was admitted for further management. Per family he was vomtting for few days worse last night. Was not eating well few days. [facility] has [facility], told to go to hospital but refused to go to hospital.    PT Comments    Pt making slow progress towards his goals today, limited in safe mobility by decreased safety awareness and decreased problem solving, in addition to generalized weakness and decreased endurance. Pt requires min A for bed mobility, transfers and ambulation with RW. D/c plans remain appropriate at this time. PT will continue to follow acutely.   Follow Up Recommendations  SNF     Equipment Recommendations  (TBD)       Precautions / Restrictions Precautions Precautions: Fall Restrictions Weight Bearing Restrictions: No    Mobility  Bed Mobility Overal bed mobility: Needs Assistance Bed Mobility: Supine to Sit     Supine to sit: Min assist Sit to supine: Supervision   General bed mobility comments: min A for supine to sit as pt tried to get up with knees and HoB elevated pt experienced increased difficulty and could not problem solve what was wrong  Transfers Overall transfer level: Needs assistance Equipment used: Rolling walker (2 wheeled) Transfers: Sit to/from Stand Sit to Stand: Min assist         General transfer comment: 2 attempts and min A for power up, initially hips too far back in bed to come up to standing, verbal and tactile cues needed to scoot hips to EoB, min A for steadying once able to power  up  Ambulation/Gait Ambulation/Gait assistance: Min assist Gait Distance (Feet): 100 Feet Assistive device: Rolling walker (2 wheeled) Gait Pattern/deviations: Step-through pattern;Shuffle;Trunk flexed Gait velocity: decreased Gait velocity interpretation: <1.8 ft/sec, indicate of risk for recurrent falls General Gait Details: pt requires min A for navigating obstacles in hallway, poor insight into how to maneuver RW around objects        Balance Overall balance assessment: Needs assistance   Sitting balance-Leahy Scale: Good       Standing balance-Leahy Scale: Fair                              Cognition Arousal/Alertness: Awake/alert Behavior During Therapy: WFL for tasks assessed/performed Overall Cognitive Status: History of cognitive impairments - at baseline                     Current Attention Level: Selective Memory: Decreased recall of precautions;Decreased short-term memory Following Commands: Follows one step commands consistently Safety/Judgement: Decreased awareness of safety     General Comments: continues to require increased cuing to complete tasks at hand,              Pertinent Vitals/Pain Pain Assessment: No/denies pain           PT Goals (current goals can now be found in the care plan section) Acute Rehab PT Goals Patient Stated Goal: non stated PT Goal Formulation: With patient Time For Goal Achievement: 05/03/18 Potential to Achieve Goals: Good  Frequency    Min 3X/week      PT Plan Current plan remains appropriate       AM-PAC PT "6 Clicks" Mobility   Outcome Measure  Help needed turning from your back to your side while in a flat bed without using bedrails?: None Help needed moving from lying on your back to sitting on the side of a flat bed without using bedrails?: A Little Help needed moving to and from a bed to a chair (including a wheelchair)?: A Little Help needed standing up from a chair  using your arms (e.g., wheelchair or bedside chair)?: A Little Help needed to walk in hospital room?: A Little Help needed climbing 3-5 steps with a railing? : A Little 6 Click Score: 19    End of Session Equipment Utilized During Treatment: Gait belt Activity Tolerance: Patient tolerated treatment well Patient left: in bed Nurse Communication: Mobility status PT Visit Diagnosis: Unsteadiness on feet (R26.81)     Time: [id number] PT Time Calculation (min) (ACUTE ONLY): 13 min  Charges:  $Gait Training: 8-22 mins                     Philip [name] PT, DPT Acute Rehabilitation Services Pager [phone] Office [phone]    Philip [name] 04/20/2018, 3:07 PM

## 2018-04-21 LAB — GLUCOSE, CAPILLARY
Glucose-Capillary: 288 mg/dL — ABNORMAL HIGH (ref 70–99)
Glucose-Capillary: 320 mg/dL — ABNORMAL HIGH (ref 70–99)

## 2018-04-21 MED ORDER — INSULIN GLARGINE 100 UNIT/ML ~~LOC~~ SOLN
10.0000 [IU] | Freq: Every day | SUBCUTANEOUS | Status: DC
  Administered 2018-04-21: 10 [IU] via SUBCUTANEOUS
  Filled 2018-04-21 (×2): qty 0.1

## 2018-04-21 NOTE — TOC Transition Note (Signed)
Transition of Care [facility]) - CM/SW Discharge Note   Patient Details  Name: Philip [name]. MRN: [id number] Date of Birth: 15-May-1950  Transition of Care [facility]) CM/SW Contact:  Margarito Liner, LCSW Phone Number: 04/21/2018, 12:42 PM   Clinical Narrative: CSW facilitated patient discharge including contacting patient family and facility to confirm patient discharge plans. Clinical information faxed to facility and family agreeable with plan. CSW arranged ambulance transport via [facility] to [facility]. RN to call report prior to discharge [phone]).  CSW will sign off for now as social work intervention is no longer needed. Please consult [place] again if new needs arise.  Final next level of care: Skilled Nursing Facility Barriers to Discharge: Barriers Resolved   Patient Goals and CMS Choice Patient states their goals for this hospitalization and ongoing recovery are:: Patient not fully oriented. CMS Medicare.gov Compare Post Acute Care list provided to:: Patient Represenative (must comment)(Pt son, Jamil Pigeon) Choice offered to / list presented to : Adult Children  Discharge Placement   Existing PASRR number confirmed : 04/20/18          Patient [facility] bed at: [facility] Patient to be transferred to facility by: [facility] Name of family member notified: Caryn Bee Hallenbeck and [name] Patient and family notified of of transfer: 04/21/18  Discharge Plan and Services In-house Referral: Clinical Social Work Discharge Planning Services: NA Post Acute Care Choice: NA          DME Arranged: N/A DME Agency: NA HH Arranged: NA HH Agency: NA   Social Determinants of Health (SDOH) Interventions     Readmission Risk Interventions Readmission Risk Prevention Plan 04/20/2018  Transportation Screening Complete  PCP or Specialist Appt within 5-7 Days Complete  Home Care Screening Complete  Medication Review (RN CM) Complete  Some recent data might be hidden

## 2018-04-21 NOTE — Discharge Summary (Signed)
Physician Discharge Summary  [facility] Philip [name]. INO:[id number] DOB: 03-08-1950 DOA: 04/17/2018  PCP: System, Pcp Not In  Admit date: 04/17/2018 Discharge date: 04/21/2018  Admitted From: home Disposition: Skilled nursing facility  Recommendations for Outpatient Follow-up:  1. Follow up with PCP in 1-2 weeks 2. Please obtain BMP/CBC in one week   Home Health: None Equipment/Devices: None Discharge Condition: Stable CODE STATUS: Full code Diet recommendation: Diabetic diet  Brief/Interim Summary:  68 year old male with history of CAD, IDDM, history of CVA with residual deficits, who is a poor historian presented with nausea vomiting.  In the ER found to have mild AKI with creatinine 1.8 from baseline 0.9 in February, calcium 11.3, was given IV fluids and was admitted for further management. Per family he was vomtting for few days worse last night. Was not eating well few days. He has [facility], told to go to hospital but refused to go to hospital. He was seen in ER, found to have AKI and was admitted wit iv saline. AKI improving nicely With hydration.  At this time patient is medically stable.  PT OT SW consulted for placement after discussion with patient's family. At this time patient is clinically stabilized and improved and plan for SNF  Assessment & Plan:   Principal Problem:   AKI (acute kidney injury) (HCC) Active Problems:   CAD (coronary artery disease) of artery bypass graft   Nausea & vomiting   History of CVA with residual deficit   Insulin-requiring or dependent type II diabetes mellitus (HCC)   Hypercalcemia   AKI,Suspect prerenal from volume depletion with nausea vomiting.  Creatinine has improved nicely as below with IV fluid hydration.  Stopped IV fluids and encourage oral intake. Creatinine trend as below. Recent Labs  Lab 04/17/18 2303 04/18/18 0342 04/19/18 0503  CREATININE 1.82* 1.72* 1.28*   Nausea vomiting: Resolved, tolerating diet.   Continue supportive care, PPI and antiemetics as needed.   Mild hypercalcemia secondary to multiple repletion, calcium improved.   CAD/ Positive troponin, flat 0.04, 0.06, without chest pain or acute EKG changes. Continue aspirin, Plavix statin and beta-blocker history of CAD.  History of CVA/memory issues/poor historian/expressive aphasia: I called his contact-ex-wife who lives with him and has grandson who is 73 yo who essentially takes care of him. They live in house with owner who has medicla issues, they feel he may need to go to NH.He has memory issues and progressively worse since his stroke.He is at baseline irate, curses all family, and mentally was getting worse. He was in Rehab recently. Consulted SW ,PT/OT and plan for skilled nursing facility placement.  ? may Need need long-term placement   IDDM: HBA1C AT 7.7, 02/19/18, blood sugar poorly controlled add lantus while in house. Resume home insulin on d.c   Hypomagnesemia was repleted.  DVT prophylaxis: sq Heparin Code Status: full Family Communication: updated ex-wife over phone and for SNF Disposition Plan: SNF when bed available   Consultants:  None  Procedures: None  Discharge Instructions  Discharge Instructions    Call MD for:   Complete by:  As directed    Please call call MD or return to ER for similar recurring problem, nausea/vomiting, uncontrolled pain, abdominal pain chest pain, shortness of breath, fever. Please follow-up your doctor as instructed in a week time and call the office for appointment. Please avoid alcohol, smoking, or any other illicit substance.   Call MD for:   Complete by:  As directed    Please  call call MD or return to ER for similar recurring problem, nausea/vomiting, uncontrolled pain, abdominal pain chest pain, shortness of breath, fever. Please follow-up your doctor as instructed in a week time and call the office for appointment. Please avoid alcohol, smoking, or any other illicit  substance.   Diet Carb Modified   Complete by:  As directed    Increase activity slowly   Complete by:  As directed    Increase activity slowly   Complete by:  As directed      Allergies as of 04/21/2018      Reactions   Codeine    GI upset      Medication List    TAKE these medications   aspirin EC 81 MG tablet Take 81 mg by mouth daily.   atorvastatin 80 MG tablet Commonly known as:  LIPITOR Take 1 tablet (80 mg total) by mouth daily at 6 PM.   calcium carbonate 500 MG chewable tablet Commonly known as:  TUMS - dosed in mg elemental calcium Chew 2 tablets by mouth as needed for indigestion or heartburn.   citalopram 20 MG tablet Commonly known as:  CELEXA Take 20 mg by mouth daily.   clopidogrel 75 MG tablet Commonly known as:  PLAVIX Take 75 mg by mouth daily.   insulin NPH Human 100 UNIT/ML injection Commonly known as:  HumuLIN N Inject 0.05 mLs (5 Units total) into the skin 2 (two) times daily before a meal. What changed:  how much to take   isosorbide mononitrate 30 MG 24 hr tablet Commonly known as:  IMDUR Take 1 tablet (30 mg total) by mouth daily.   loperamide 2 MG capsule Commonly known as:  IMODIUM Take 4 mg by mouth as needed for diarrhea or loose stools.   metFORMIN 1000 MG tablet Commonly known as:  GLUCOPHAGE Take 1,000 mg by mouth 2 (two) times daily with a meal.   Metoprolol Tartrate 37.5 MG Tabs Take 37.5 mg by mouth 2 (two) times daily.   nitroGLYCERIN 0.4 MG/SPRAY spray Commonly known as:  NITROLINGUAL Place 1 spray under the tongue every 5 (five) minutes x 3 doses as needed for chest pain.       Allergies  Allergen Reactions  . Codeine     GI upset    Consultations: None  Procedures/Studies: Ct Abdomen Pelvis W Contrast  Result Date: 04/18/2018 CLINICAL DATA:  Nausea and vomiting. EXAM: CT ABDOMEN AND PELVIS WITH CONTRAST TECHNIQUE: Multidetector CT imaging of the abdomen and pelvis was performed using the standard  protocol following bolus administration of intravenous contrast. CONTRAST:  80mL OMNIPAQUE IOHEXOL 300 MG/ML  SOLN COMPARISON:  02/16/2018 FINDINGS: Lower chest: Resolved pleural effusions from prior CT. No acute airspace disease. There are coronary artery calcifications. Hepatobiliary: No focal hepatic lesion. Mild intrahepatic biliary ductal dilatation on the left. Status post cholecystectomy. No biliary dilatation. Pancreas: Mild parenchymal atrophy. No ductal dilatation or inflammation. Spleen: Normal in size without focal abnormality. Adrenals/Urinary Tract: Normal adrenal glands. No hydronephrosis or perinephric edema. Homogeneous renal enhancement with symmetric excretion on delayed phase imaging. Nonobstructing stones in the kidneys versus vascular calcifications. There are small bilateral renal cysts. Urinary bladder is physiologically distended without wall thickening. Stomach/Bowel: Small hiatal hernia. Fluid-filled stomach. Mild pre pyloric gastric wall thickening. No small bowel dilatation, inflammation, or obstruction. Normal appendix. Moderate volume of stool throughout the colon. Stool distends the rectum. Rectal diameter of 6.3 cm without rectal wall thickening. Vascular/Lymphatic: Moderate aortic and branch atherosclerosis. No aneurysm. No  enlarged lymph nodes in the abdomen or pelvis. Reproductive: Prominent prostate gland causes mild mass effect on the bladder base. Other: Fat in the inguinal canals, left greater than right. Subcutaneous densities in the anterior abdominal wall most consistent with medication injection sites No free air, free fluid, or intra-abdominal fluid collection. Musculoskeletal: There are no acute or suspicious osseous abnormalities. Degenerative disc disease at L4-L5. IMPRESSION: 1. Mild pre pyloric gastric wall thickening, may be gastritis or peptic ulcer disease. 2. Moderate colonic stool burden, suggesting constipation. No bowel obstruction or inflammation. 3.  Incidental small hiatal hernia. Aortic Atherosclerosis (ICD10-I70.0). Electronically Signed   By: Narda Rutherford M.D.   On: 04/18/2018 01:39    Subjective: Resting well at the bedside chair, tolerating diet, no nausea or vomiting.  Voiding well.  With baseline dementia.  Discharge Exam: Vitals:   04/21/18 0132 04/21/18 0500  BP: (!) 121/53 (!) 118/55  Pulse: 71 69  Resp: 18 20  Temp: 98.4 F (36.9 C) 98.2 F (36.8 C)  SpO2: 97% 97%   Vitals:   04/20/18 1132 04/21/18 0132 04/21/18 0500 04/21/18 0504  BP: (!) 143/99 (!) 121/53 (!) 118/55   Pulse: 62 71 69   Resp:  18 20   Temp: 97.7 F (36.5 C) 98.4 F (36.9 C) 98.2 F (36.8 C)   TempSrc: Oral Oral Oral   SpO2: 97% 97% 97%   Weight:    72.8 kg  Height:        General: Pt is alert, awake, not in acute distress Cardiovascular: RRR, S1/S2 +, no rubs, no gallops Respiratory: CTA bilaterally, no wheezing, no rhonchi Abdominal: Soft, NT, ND, bowel sounds + Extremities: no edema, no cyanosis Neuro : alert awake, with baseline memory issues, follows commands able to communicate.   The results of significant diagnostics from this hospitalization (including imaging, microbiology, ancillary and laboratory) are listed below for reference.     Microbiology: Recent Results (from the past 240 hour(s))  MRSA PCR Screening     Status: None   Collection Time: 04/18/18  4:00 AM  Result Value Ref Range Status   MRSA by PCR NEGATIVE NEGATIVE Final    Comment:        The GeneXpert MRSA Assay (FDA approved for NASAL specimens only), is one component of a comprehensive MRSA colonization surveillance program. It is not intended to diagnose MRSA infection nor to guide or monitor treatment for MRSA infections. Performed at [facility], [address], [place], [place] [address]      Labs: BNP (last 3 results) Recent Labs    02/15/18 0230  BNP 124.5*   Basic Metabolic Panel: Recent Labs  Lab 04/17/18 2303  04/18/18 0342 04/19/18 0503  NA 138 138 140  K 4.9 5.1 4.1  CL 95* 97* 104  CO2 GLUCOSE 168* 194* 122*  BUN 29* 27* 17  CREATININE 1.82* 1.72* 1.28*  CALCIUM 11.3* 10.1 8.6*  MG  --  1.2*  --    Liver Function Tests: Recent Labs  Lab 04/17/18 2303 04/18/18 0342  AST 30 23  ALT 48* 41  ALKPHOS 74 71  BILITOT 0.9 1.1  PROT 7.3 6.3*  ALBUMIN 4.1 3.6   Recent Labs  Lab 04/17/18 2303  LIPASE 22   No results for input(s): AMMONIA in the last 168 hours. CBC: Recent Labs  Lab 04/17/18 2303 04/18/18 0342  WBC 9.6 9.3  NEUTROABS 7.9* 7.9*  HGB 14.4 13.5  HCT 44.0 40.6  MCV 90.0 89.2  PLT 208 171   Cardiac Enzymes: Recent Labs  Lab 04/17/18 2303 04/18/18 0159 04/18/18 0342  TROPONINI 0.03* 0.04* 0.06*   BNP: Invalid input(s): POCBNP CBG: Recent Labs  Lab 04/20/18 0555 04/20/18 1131 04/20/18 1603 04/20/18 2104 04/21/18 0555  GLUCAP 244* 236* 364* 189* 288*   D-Dimer No results for input(s): DDIMER in the last 72 hours. Hgb A1c No results for input(s): HGBA1C in the last 72 hours. Lipid Profile No results for input(s): CHOL, HDL, LDLCALC, TRIG, CHOLHDL, LDLDIRECT in the last 72 hours. Thyroid function studies No results for input(s): TSH, T4TOTAL, T3FREE, THYROIDAB in the last 72 hours.  Invalid input(s): FREET3 Anemia work up No results for input(s): VITAMINB12, FOLATE, FERRITIN, TIBC, IRON, RETICCTPCT in the last 72 hours. Urinalysis    Component Value Date/Time   COLORURINE YELLOW 04/18/2018 0237   APPEARANCEUR CLEAR 04/18/2018 0237   LABSPEC 1.034 (H) 04/18/2018 0237   PHURINE 6.0 04/18/2018 0237   GLUCOSEU 50 (A) 04/18/2018 0237   HGBUR NEGATIVE 04/18/2018 0237   BILIRUBINUR NEGATIVE 04/18/2018 0237   KETONESUR 20 (A) 04/18/2018 0237   PROTEINUR NEGATIVE 04/18/2018 0237   NITRITE NEGATIVE 04/18/2018 0237   LEUKOCYTESUR NEGATIVE 04/18/2018 0237   Sepsis Labs Invalid input(s): PROCALCITONIN,  WBC,   LACTICIDVEN Microbiology Recent Results (from the past 240 hour(s))  MRSA PCR Screening     Status: None   Collection Time: 04/18/18  4:00 AM  Result Value Ref Range Status   MRSA by PCR NEGATIVE NEGATIVE Final    Comment:        The GeneXpert MRSA Assay (FDA approved for NASAL specimens only), is one component of a comprehensive MRSA colonization surveillance program. It is not intended to diagnose MRSA infection nor to guide or monitor treatment for MRSA infections. Performed at [facility], [address] [place], [place] [address]      Time coordinating discharge: 25 minutes  SIGNED:   Lanae Boastamesh Shell Yandow, MD  [facility] 04/21/2018, 9:42 AM  If 7PM-7AM, please contact night-coverage [email or web address]

## 2018-04-21 NOTE — Progress Notes (Signed)
Patient ready for discharge. 

## 2018-04-21 NOTE — Plan of Care (Signed)
  Problem: Education: Goal: Ability to demonstrate management of disease process will improve Outcome: Adequate for Discharge   Problem: Education: Goal: Ability to verbalize understanding of medication therapies will improve Outcome: Adequate for Discharge   

## 2018-04-21 NOTE — Progress Notes (Signed)
Inpatient Diabetes Program Recommendations  AACE/ADA: New Consensus Statement on Inpatient Glycemic Control  Target Ranges:  Prepandial:   less than 140 mg/dL      Peak postprandial:   less than 180 mg/dL (1-2 hours)      Critically ill patients:  140 - 180 mg/dL   Results for Philip [name], Philip [name] (MRN [id number]) as of 04/21/2018 08:09  Ref. Range 04/20/2018 05:55 04/20/2018 11:31 04/20/2018 16:03 04/20/2018 21:04 04/21/2018 05:55  Glucose-Capillary Latest Ref Range: 70 - 99 mg/dL 619 (H) 509 (H) 326 (H) 189 (H) 288 (H)   Review of Glycemic Control  Diabetes history: DM2 Outpatient Diabetes medications: NPH 20 units BID, Metformin 1000 mg BID Current orders for Inpatient glycemic control: Novolog 0-15 units TID with meals, Novolog 0-5 units QHS  Inpatient Diabetes Program Recommendations:   Insulin - Basal: Please consider ordering Lantus 14 units daily (starting this morning).  Insulin - Meal Coverage: Please consider ordering Novolog 3 units TID with meals for meal coverage if patient eats at least 50% of meals.  Thanks, Orlando Penner, RN, MSN, CDE Diabetes Coordinator Inpatient Diabetes Program [phone] (Team Pager from 8am to 5pm)

## 2018-04-21 NOTE — Progress Notes (Signed)
Occupational Therapy Treatment Patient Details Name: Philip RidgeHelmut R [name] Montez [name]. MRN: [id number] DOB: October 16, 1950 Today's Date: 04/21/2018    History of present illness 68 year old male with history of CAD, IDDM, history of CVA with residual deficits, who is a poor historian presented with nausea vomiting.  In the ER found to have mild AKI with creatinine 1.8 from baseline 0.9 in February, calcium 11.3, was given IV fluids and was admitted for further management. Per family he was vomtting for few days worse last night. Was not eating well few days. He has [facility], told to go to hospital but refused to go to hospital.   OT comments  Patient progressing slowly.  Continues to require moderate cueing and increased time for problem solving and safety awareness during functional ADLs and mobility.  Min guard for in room transfers and functional mobility, standing at sink to complete bathing with min assist, grooming with min assist.  Improving awareness, requesting to take bath this am, but continues to be limited by problem solving, sequencing, and memory, as well as decreased coordination of R UE.  Will follow.    Follow Up Recommendations  SNF;Supervision/Assistance - 24 hour    Equipment Recommendations  Other (comment)(TBD)    Recommendations for Other Services      Precautions / Restrictions Precautions Precautions: Fall Restrictions Weight Bearing Restrictions: No       Mobility Bed Mobility Overal bed mobility: Needs Assistance Bed Mobility: Supine to Sit     Supine to sit: Supervision     General bed mobility comments: supervision for safety  Transfers Overall transfer level: Needs assistance Equipment used: Rolling walker (2 wheeled) Transfers: Sit to/from Stand Sit to Stand: Min guard         General transfer comment: min guard for safety and balance, cueing for hand placement     Balance Overall balance assessment: Needs assistance Sitting-balance  support: No upper extremity supported;Feet supported Sitting balance-Leahy Scale: Good     Standing balance support: No upper extremity supported;During functional activity Standing balance-Leahy Scale: Fair Standing balance comment: 0 hand support standing at sink during ADLs with close supervision within BOS                           ADL either performed or assessed with clinical judgement   ADL Overall ADL's : Needs assistance/impaired     Grooming: Wash/dry hands;Oral care;Wash/dry face;Brushing hair;Applying deodorant;Minimal assistance;Standing Grooming Details (indicate cue type and reason): increased time and effort to complete tasks with cueing to utilize R UE, min assist required  Upper Body Bathing: Supervision/ safety;Standing   Lower Body Bathing: Minimal assistance;Sit to/from stand Lower Body Bathing Details (indicate cue type and reason): assist for hygiene mgmt with soiled wash clothes and peri care Upper Body Dressing : Minimal assistance;Standing       Toilet Transfer: Min guard;Ambulation;RW Toilet Transfer Details (indicate cue type and reason): simulated to recliner          Functional mobility during ADLs: Min guard;Rolling walker;Cueing for safety;Cueing for sequencing General ADL Comments: limited by cognition and decreased functional use of RUE      Vision       Perception     Praxis      Cognition Arousal/Alertness: Awake/alert Behavior During Therapy: WFL for tasks assessed/performed Overall Cognitive Status: History of cognitive impairments - at baseline Area of Impairment: Attention;Memory;Following commands;Safety/judgement;Awareness;Problem solving  Current Attention Level: Selective Memory: Decreased recall of precautions;Decreased short-term memory Following Commands: Follows one step commands consistently;Follows one step commands with increased time Safety/Judgement: Decreased awareness of  safety Awareness: Emergent Problem Solving: Slow processing;Decreased initiation;Difficulty sequencing;Requires verbal cues;Requires tactile cues General Comments: pt requires moderate cueing to complete basic self care tasks given increased time, improving awareness (as pt requests to take a bath today)         Exercises     Shoulder Instructions       General Comments      Pertinent Vitals/ Pain       Pain Assessment: No/denies pain  Home Living                                          Prior Functioning/Environment              Frequency  Min 2X/week        Progress Toward Goals  OT Goals(current goals can now be found in the care plan section)  Progress towards OT goals: Progressing toward goals  Acute Rehab OT Goals Patient Stated Goal: none stated   Plan Discharge plan remains appropriate;Frequency remains appropriate    Co-evaluation                 AM-PAC OT "6 Clicks" Daily Activity     Outcome Measure   Help from another person eating meals?: A Little Help from another person taking care of personal grooming?: A Little Help from another person toileting, which includes using toliet, bedpan, or urinal?: A Little Help from another person bathing (including washing, rinsing, drying)?: A Little Help from another person to put on and taking off regular upper body clothing?: A Little Help from another person to put on and taking off regular lower body clothing?: A Little 6 Click Score: 18    End of Session Equipment Utilized During Treatment: Rolling walker  OT Visit Diagnosis: Other abnormalities of gait and mobility (R26.89);Muscle weakness (generalized) (M62.81);Other symptoms and signs involving cognitive function   Activity Tolerance Patient tolerated treatment well   Patient Left in chair;with call bell/phone within reach;with chair alarm set   Nurse Communication Mobility status        Time: 2992-4268 OT Time  Calculation (min): 19 min  Charges: OT General Charges $OT Visit: 1 Visit OT Treatments $Self Care/Home Management : 8-22 mins  Chancy Milroy, OT Acute Rehabilitation Services Pager [phone] Office [phone]    Chancy Milroy 04/21/2018, 8:55 AM

## 2018-07-22 ENCOUNTER — Other Ambulatory Visit: Payer: Self-pay

## 2018-07-22 ENCOUNTER — Emergency Department: Payer: Medicare Other

## 2018-07-22 ENCOUNTER — Encounter: Payer: Self-pay | Admitting: Emergency Medicine

## 2018-07-22 ENCOUNTER — Emergency Department
Admission: EM | Admit: 2018-07-22 | Discharge: 2018-07-22 | Disposition: A | Discharge status: Other Acute Inpatient Hospital | Payer: Medicare Other | Attending: Emergency Medicine | Admitting: Emergency Medicine

## 2018-07-22 DIAGNOSIS — M25511 Pain in right shoulder: Secondary | ICD-10-CM | POA: Insufficient documentation

## 2018-07-22 DIAGNOSIS — R270 Ataxia, unspecified: Secondary | ICD-10-CM | POA: Insufficient documentation

## 2018-07-22 DIAGNOSIS — S2241XA Multiple fractures of ribs, right side, initial encounter for closed fracture: Secondary | ICD-10-CM | POA: Insufficient documentation

## 2018-07-22 DIAGNOSIS — Z7902 Long term (current) use of antithrombotics/antiplatelets: Secondary | ICD-10-CM | POA: Insufficient documentation

## 2018-07-22 DIAGNOSIS — I2510 Atherosclerotic heart disease of native coronary artery without angina pectoris: Secondary | ICD-10-CM | POA: Diagnosis not present

## 2018-07-22 DIAGNOSIS — Z951 Presence of aortocoronary bypass graft: Secondary | ICD-10-CM | POA: Insufficient documentation

## 2018-07-22 DIAGNOSIS — E119 Type 2 diabetes mellitus without complications: Secondary | ICD-10-CM | POA: Diagnosis not present

## 2018-07-22 DIAGNOSIS — W010XXA Fall on same level from slipping, tripping and stumbling without subsequent striking against object, initial encounter: Secondary | ICD-10-CM | POA: Diagnosis not present

## 2018-07-22 DIAGNOSIS — Z7982 Long term (current) use of aspirin: Secondary | ICD-10-CM | POA: Diagnosis not present

## 2018-07-22 DIAGNOSIS — Z794 Long term (current) use of insulin: Secondary | ICD-10-CM | POA: Insufficient documentation

## 2018-07-22 DIAGNOSIS — W19XXXA Unspecified fall, initial encounter: Secondary | ICD-10-CM

## 2018-07-22 DIAGNOSIS — R0789 Other chest pain: Secondary | ICD-10-CM | POA: Insufficient documentation

## 2018-07-22 DIAGNOSIS — Z79899 Other long term (current) drug therapy: Secondary | ICD-10-CM | POA: Diagnosis not present

## 2018-07-22 DIAGNOSIS — Y999 Unspecified external cause status: Secondary | ICD-10-CM | POA: Diagnosis not present

## 2018-07-22 DIAGNOSIS — Z8673 Personal history of transient ischemic attack (TIA), and cerebral infarction without residual deficits: Secondary | ICD-10-CM | POA: Insufficient documentation

## 2018-07-22 DIAGNOSIS — I10 Essential (primary) hypertension: Secondary | ICD-10-CM | POA: Diagnosis not present

## 2018-07-22 DIAGNOSIS — Y929 Unspecified place or not applicable: Secondary | ICD-10-CM | POA: Diagnosis not present

## 2018-07-22 DIAGNOSIS — Y939 Activity, unspecified: Secondary | ICD-10-CM | POA: Insufficient documentation

## 2018-07-22 DIAGNOSIS — S299XXA Unspecified injury of thorax, initial encounter: Secondary | ICD-10-CM | POA: Diagnosis present

## 2018-07-22 HISTORY — DX: Essential (primary) hypertension: I10

## 2018-07-22 HISTORY — DX: Hyperlipidemia, unspecified: E78.5

## 2018-07-22 HISTORY — DX: Insomnia, unspecified: G47.00

## 2018-07-22 LAB — BASIC METABOLIC PANEL
Anion gap: 10 (ref 5–15)
BUN: 24 mg/dL — ABNORMAL HIGH (ref 8–23)
CO2: 25 mmol/L (ref 22–32)
Calcium: 9 mg/dL (ref 8.9–10.3)
Chloride: 103 mmol/L (ref 98–111)
Creatinine, Ser: 1.35 mg/dL — ABNORMAL HIGH (ref 0.61–1.24)
GFR calc Af Amer: >60 mL/min (ref >60)
GFR calc non Af Amer: 53 mL/min — ABNORMAL LOW (ref >60)
Glucose, Bld: 199 mg/dL — ABNORMAL HIGH (ref 70–99)
Potassium: 4.7 mmol/L (ref 3.5–5.1)
Sodium: 138 mmol/L (ref 135–145)

## 2018-07-22 LAB — CBC
HCT: 35 % — ABNORMAL LOW (ref 39.0–52.0)
Hemoglobin: 11.2 g/dL — ABNORMAL LOW (ref 13.0–17.0)
MCH: 29.6 pg (ref 26.0–34.0)
MCHC: 32 g/dL (ref 30.0–36.0)
MCV: 92.6 fL (ref 80.0–100.0)
Platelets: 191 10*3/uL (ref 150–400)
RBC: 3.78 MIL/uL — ABNORMAL LOW (ref 4.22–5.81)
RDW: 15.8 % — ABNORMAL HIGH (ref 11.5–15.5)
WBC: 7.7 10*3/uL (ref 4.0–10.5)
nRBC: 0 % (ref 0.0–0.2)

## 2018-07-22 MED ORDER — HYDROMORPHONE HCL 1 MG/ML IJ SOLN
1.0000 mg | Freq: Once | INTRAMUSCULAR | Status: AC
  Administered 2018-07-22: 1 mg via INTRAVENOUS

## 2018-07-22 MED ORDER — ONDANSETRON HCL 4 MG/2ML IJ SOLN
4.0000 mg | Freq: Once | INTRAMUSCULAR | Status: AC
  Administered 2018-07-22: 4 mg via INTRAVENOUS
  Filled 2018-07-22: qty 2

## 2018-07-22 MED ORDER — HYDROMORPHONE HCL 1 MG/ML IJ SOLN
1.0000 mg | Freq: Once | INTRAMUSCULAR | Status: AC
  Administered 2018-07-22: 1 mg via INTRAVENOUS
  Filled 2018-07-22: qty 1

## 2018-07-22 MED ORDER — IOHEXOL 300 MG/ML  SOLN
75.0000 mL | Freq: Once | INTRAMUSCULAR | Status: AC | PRN
  Administered 2018-07-22: 16:00:00 100 mL via INTRAVENOUS

## 2018-07-22 MED ORDER — FENTANYL CITRATE (PF) 100 MCG/2ML IJ SOLN
25.0000 ug | Freq: Once | INTRAMUSCULAR | Status: AC
  Administered 2018-07-22: 25 ug via INTRAVENOUS
  Filled 2018-07-22: qty 2

## 2018-07-22 MED ORDER — ACETAMINOPHEN 325 MG PO TABS
650.0000 mg | ORAL_TABLET | Freq: Once | ORAL | Status: AC
  Administered 2018-07-22: 650 mg via ORAL
  Filled 2018-07-22: qty 2

## 2018-07-22 MED ORDER — HYDROMORPHONE HCL 1 MG/ML IJ SOLN
INTRAMUSCULAR | Status: AC
  Filled 2018-07-22: qty 1

## 2018-07-22 MED ORDER — LIDOCAINE 5 % EX PTCH
1.0000 | MEDICATED_PATCH | CUTANEOUS | Status: DC
  Administered 2018-07-22: 1 via TRANSDERMAL
  Filled 2018-07-22: qty 1

## 2018-07-22 NOTE — ED Notes (Signed)
meds given for pain.  No resp distress.  Pt alert.  siderails up x 2.

## 2018-07-22 NOTE — ED Provider Notes (Addendum)
Procedures  Clinical Course as of Jul 22 1715  Wed Jul 22, 2018  1647 CT shows 4 rib fractures with a small pleural effusion on the right.  No hemothorax.  Patient is having severe pain necessitating hospitalization for observation and pain control.  I will contact [facility] trauma to request transfer.   [PS]    Clinical Course User Index [PS] Carrie Mew, MD    ----------------------------------------- 5:17 PM on 07/22/2018 -----------------------------------------  Discussed with Dr. Juanda Crumble of [facility] trauma surgery who agrees with transfer and requests ED to ED.  I discussed with Dr. Thera Flake of [facility] emergency medicine who accepts for transfer.  Notably CT of the head shows evidence of prior strokes, which the patient was unaware of.  He may benefit from a stroke work-up which can be evaluated by receiving hospital.  ----------------------------------------- 9:07 PM on 07/22/2018 -----------------------------------------  Remained stable in the ED.  Transport service arriving, patient still appropriate for transfer at this time.  Final diagnoses:  Closed fracture of multiple ribs of right side, initial encounter  Fall, initial encounter  Intractable chest wall pain    Carrie Mew, MD 07/22/18 1719    Carrie Mew, MD 07/22/18 2107

## 2018-07-22 NOTE — Care Management (Addendum)
Patient Philip [name] notification that patient has been followed by [facility] home [facility]. Cassie with [facility] update. Per Cassie patient is open for nursing and physical therapy.

## 2018-07-22 NOTE — ED Triage Notes (Signed)
Patient from the [facility] via [facility]. Per patient, he was in the shower when "something happened" and he fell. Patient now complaining of pain to right shoulder and ribs. Patient noted to have dark coloration on right side of face, however he is unsure if he hit his head when he fell or if this is al\ways there. Patient only oriented to self and situation upon arrival.

## 2018-07-22 NOTE — ED Notes (Signed)
Pt alert  No resp distress  Sinus brady on monitor.  Pt waiting on transfer to [facility] via [facility].

## 2018-07-22 NOTE — ED Notes (Signed)
Pt alert.  No resp distress.  Sinus brady on monitor.  Iv in place

## 2018-07-22 NOTE — ED Provider Notes (Signed)
[facility] Emergency Department Provider Note  ____________________________________________   None    (approximate)  I have reviewed the triage vital signs and the nursing notes.   HISTORY  Chief Complaint Fall    HPI Philip R Lichty [name] is a 68 y.o. male with prior heart surgery on aspirin and Plavix who presents for fall.  Patient had a fall today.  Sounds mechanical in nature when he slipped on some water.  Denies any chest pain or shortness of breath prior to the fall.  Patient fell onto his right side.  Patient is endorsing some right shoulder pain as well as some right rib pain.  Patient denies any fevers, urinary symptoms or any other concerns at this time.  He also endorses some right hip pain but he is able to lift his hip off the bed.  Pain on the chest wall is moderate, constant, worse with moving, nothing makes it better.  Fall occurred a few hours prior to arrival.      Past Medical History:  Diagnosis Date   Diabetes mellitus (HCC)    Hyperlipemia    Hypertension    Insomnia     Patient Active Problem List   Diagnosis Date Noted   AKI (acute kidney injury) (HCC) 04/18/2018   History of CVA with residual deficit 04/18/2018   Insulin-requiring or dependent type II diabetes mellitus (HCC) 04/18/2018   Hypercalcemia 04/18/2018   Hypoxemia    Dehydration    Nausea & vomiting    Weight loss 10/19/2016   Essential hypertension 10/19/2016   CAD (coronary artery disease) of artery bypass graft 10/19/2016   GERD (gastroesophageal reflux disease) 10/19/2016    Past Surgical History:  Procedure Laterality Date   CORONARY ARTERY BYPASS GRAFT     LEFT HEART CATH AND CORS/GRAFTS ANGIOGRAPHY N/A 02/23/2018   Procedure: LEFT HEART CATH AND CORS/GRAFTS ANGIOGRAPHY;  Surgeon: Tonny Bollmanooper, Michael, MD;  [facility];  Service: Cardiovascular;  Laterality: N/A;    Prior to Admission medications   Medication Sig  Start Date End Date Taking? Authorizing Provider  aspirin EC 81 MG tablet Take 81 mg by mouth daily.    [provider]  atorvastatin (LIPITOR) 80 MG tablet Take 1 tablet (80 mg total) by mouth daily at 6 PM. 02/25/18   Claudean SeveranceKrienke, Marissa M, MD  calcium carbonate (TUMS - DOSED IN MG ELEMENTAL CALCIUM) 500 MG chewable tablet Chew 2 tablets by mouth as needed for indigestion or heartburn.    [provider]  citalopram (CELEXA) 20 MG tablet Take 20 mg by mouth daily.    [provider]  clopidogrel (PLAVIX) 75 MG tablet Take 75 mg by mouth daily.    [provider]  insulin NPH Human (HUMULIN N) 100 UNIT/ML injection Inject 0.05 mLs (5 Units total) into the skin 2 (two) times daily before a meal. Patient taking differently: Inject 20 Units into the skin 2 (two) times daily before a meal.  10/20/16   Maxie BarbBhandari, Dron Prasad, MD  isosorbide mononitrate (IMDUR) 30 MG 24 hr tablet Take 1 tablet (30 mg total) by mouth daily. 02/26/18   Claudean SeveranceKrienke, Marissa M, MD  loperamide (IMODIUM) 2 MG capsule Take 4 mg by mouth as needed for diarrhea or loose stools.    [provider]  metFORMIN (GLUCOPHAGE) 1000 MG tablet Take 1,000 mg by mouth 2 (two) times daily with a meal.    [provider]  Metoprolol Tartrate 37.5 MG TABS Take 37.5  mg by mouth 2 (two) times daily. 02/25/18   Asencion Noble, MD  nitroGLYCERIN (NITROLINGUAL) 0.4 MG/SPRAY spray Place 1 spray under the tongue every 5 (five) minutes x 3 doses as needed for chest pain.    [provider]    Allergies Codeine  Family History  Problem Relation Age of Onset   Colon cancer Father     Social History Social History   Tobacco Use   Smoking status: Never Smoker   Smokeless tobacco: Never Used  Substance Use Topics   Alcohol use: No   Drug use: No      Review of Systems Constitutional: No fever/chills Eyes: No visual changes. ENT: No sore throat. Cardiovascular: Denies chest  pain.  Chest wall pain Respiratory: Denies shortness of breath. Gastrointestinal: No abdominal pain.  No nausea, no vomiting.  No diarrhea.  No constipation. Genitourinary: Negative for dysuria. Musculoskeletal: Negative for back pain.  Shoulder pain hip pain Skin: Negative for rash. Neurological: Negative for headaches, focal weakness or numbness. All other ROS negative ____________________________________________   PHYSICAL EXAM:  VITAL SIGNS: ED Triage Vitals [07/22/18 1415]  Enc Vitals Group     BP (!) 162/78     Pulse Rate (!) 58     Resp 20     Temp 98.3 F (36.8 C)     Temp Source Oral     SpO2 99 %     Weight 160 lb (72.6 kg)     Height 5\' 11"  (1.803 m)     Head Circumference      Peak Flow      Pain Score      Pain Loc      Pain Edu?      Excl. in [place]?     Constitutional: Alert and oriented. GCS 15  Eyes: Conjunctivae are normal. EOMI. Head: Atraumatic. Nose: No congestion/rhinnorhea. Mouth/Throat: Mucous membranes are moist.   Neck: No stridor. Trachea Midline. FROM Cardiovascular: Normal rate, regular rhythm. Grossly normal heart sounds.  Good peripheral circulation. No chest wall tenderness Respiratory: Normal respiratory effort.  No retractions. Lungs CTAB. Gastrointestinal: Soft and nontender. No distention. No abdominal bruits.  Musculoskeletal:   RUE: Tenderness on the right shoulder without obvious deformity. Radial pulse intact. Neuro intact. Full ROM in joint. LUE: No point tenderness, deformity or other signs of injury. Radial pulse intact. Neuro intact. Full ROM in joints RLE: Tenderness on the right hip, no deformity or other signs of injury. DP pulse intact. Neuro intact. Full ROM in joints. LLE: No point tenderness, deformity or other signs of injury. DP pulse intact. Neuro intact. Full ROM in joints. Neurologic:  Normal speech and language. No gross focal neurologic deficits are appreciated.  Skin:  Skin is warm, dry and intact. No rash  noted. Psychiatric: Mood and affect are normal. Speech and behavior are normal. GU: Deferred  No C, T, L-spine tenderness ____________________________________________   LABS (all labs ordered are listed, but only abnormal results are displayed)  Labs Reviewed  BASIC METABOLIC PANEL - Abnormal; Notable for the following components:      Result Value   Glucose, Bld 199 (*)    BUN 24 (*)    Creatinine, Ser 1.35 (*)    GFR calc non Af Amer 53 (*)    All other components within normal limits  CBC - Abnormal; Notable for the following components:   RBC 3.78 (*)    Hemoglobin 11.2 (*)    HCT 35.0 (*)    RDW  15.8 (*)    All other components within normal limits  URINALYSIS, COMPLETE (UACMP) WITH MICROSCOPIC   ____________________________________________   ED ECG REPORT I, Concha SeMary E Que Meneely, the attending physician, personally viewed and interpreted this ECG.  EKG is sinus bradycardia rate of 57, no ST elevation, no T wave inversions, normal intervals  ____________________________________________  RADIOLOGY Vela ProseI, Chitara Clonch E Kiptyn Rafuse, personally viewed and evaluated these images (plain radiographs) as part of my medical decision making, as well as reviewing the written report by the radiologist.  ED MD interpretation: Possible rib fractures on the right will await reads from radiology to confirm.  Official radiology report(s): Dg Ribs Unilateral W/chest Right  Result Date: 07/22/2018 CLINICAL DATA:  Fall, right rib pain. EXAM: RIGHT RIBS AND CHEST - 3+ VIEW COMPARISON:  02/17/2018 FINDINGS: Prior median sternotomy. Heart is normal size. Lungs are clear. Trace right pleural effusion suspected. No pneumothorax. Rib fractures through the lateral 9th and 10th ribs. IMPRESSION: Right 9th and 10th rib fractures with probable trace effusion. No pneumothorax. Electronically Signed   By: Charlett NoseKevin  Dover M.D.   On: 07/22/2018 15:33   Dg Shoulder Right  Result Date: 07/22/2018 CLINICAL DATA:  Right shoulder  and rib pain after a fall today. EXAM: RIGHT SHOULDER - 2+ VIEW COMPARISON:  07/01/2006 FINDINGS: No acute fracture or dislocation is identified. 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 The soft tissues are unremarkable. IMPRESSION: No acute osseous abnormality identified. Electronically Signed   By: Sebastian AcheAllen  Grady M.D.   On: 07/22/2018 15:33   Ct Head Wo Contrast  Result Date: 07/22/2018 CLINICAL DATA:  Head trauma, ataxia EXAM: CT HEAD WITHOUT CONTRAST TECHNIQUE: Contiguous axial images were obtained from the base of the skull through the vertex without intravenous contrast. COMPARISON:  None. FINDINGS: Brain: Old infarcts noted throughout the left temporal lobe and left occipital lobe. Small area of bone infarction in the right frontal lobe. No acute infarction. No hemorrhage or hydrocephalus. Vascular: No hyperdense vessel or unexpected calcification. Skull: No acute calvarial abnormality. Sinuses/Orbits: Visualized paranasal sinuses and mastoids clear. Orbital soft tissues unremarkable. Other: None IMPRESSION: Old infarcts in the left temporal, occipital, and right frontal lobes. No acute intracranial abnormality. Electronically Signed   By: Charlett NoseKevin  Dover M.D.   On: 07/22/2018 15:03    ____________________________________________   PROCEDURES  Procedure(s) performed (including Critical Care):  Procedures   ____________________________________________   INITIAL IMPRESSION / ASSESSMENT AND PLAN / ED COURSE    Philip R Litzau [name] was evaluated in Emergency Department on 07/22/2018 for the symptoms described in the history of present illness. He was evaluated in the context of the global COVID-19 pandemic, which necessitated consideration that the patient might be at risk for infection with the SARS-CoV-2 virus that causes COVID-19. Institutional protocols and algorithms that pertain to the evaluation of patients at risk for COVID-19 are in a state of rapid change based on  information released by regulatory bodies including the CDC and federal and state organizations. These policies and algorithms were followed during the patient's care in the ED.    Patient denies hitting his head but given his age and on Plavix will get a CT head to evaluate for epidural subdural hematoma.  Patient's cervical spine cleared by [facility].  Will get x-rays to evaluate for rib fractures, shoulder fracture, hip fracture.  Low suspicion for shoulder fracture and hip fracture given patient's range of motion.  Patient denies syncope-like symptoms but will get some basic labs to further evaluate and EKG.  Patient denied any chest pain or shortness of breath prior to the fall.   Creatinine is 1.35 which is around patient's baseline.  Glucose is slightly elevated but no evidence of anion gap therefore unlikely DKA.  CT head shows evidence of old infarcts.  3:24 PM while patient was being moved over for x-ray had severe pain and sounds like some vomiting.  Will give some Zofran.  Patient handed off to oncoming doctor.  Patient is pending x-rays.  If x-rays do not show any rib fractures patient will need a CT scan to rule out given my high concern.  Depending on the number of x-rays patient may warrant admission for pain control. ____________________________________________   FINAL CLINICAL IMPRESSION(S) / ED DIAGNOSES   Final diagnoses:  None      MEDICATIONS GIVEN DURING THIS VISIT:  Medications - No data to display   ED Discharge Orders    None       Note:  This document was prepared using Dragon voice recognition software and may include unintentional dictation errors.   Concha SeFunke, Yun Gutierrez E, MD 07/22/18 1600

## 2018-07-22 NOTE — ED Notes (Addendum)
Pain meds given.  [place] state here to transfer pt to [facility].  Pt alert

## 2018-07-22 NOTE — ED Notes (Signed)
Resumed care from [place] rn.  Pt laert.  meds given  siderails up x 2.  Sinus brady on monitor.

## 2018-07-22 NOTE — ED Notes (Signed)
This RN called to xray. Patient actively vomiting in xray. Per [place], when they were moving him to obtain images, he became pale and diaphoretic and began to vomit. Unable to obtain full set of images due to patient discomfort. Patient returned to ED and reattached to monitor. MD made aware.

## 2018-08-08 DEATH — deceased

## 2020-02-24 IMAGING — DX DG CHEST 1V PORT
1 series · 1 of 1 positions shown · non-contrast
Comparison: Chest x-ray 02/16/2018.

CLINICAL DATA: 68-year-old male with history of hypotension.

EXAM:
PORTABLE CHEST 1 VIEW

[chest]
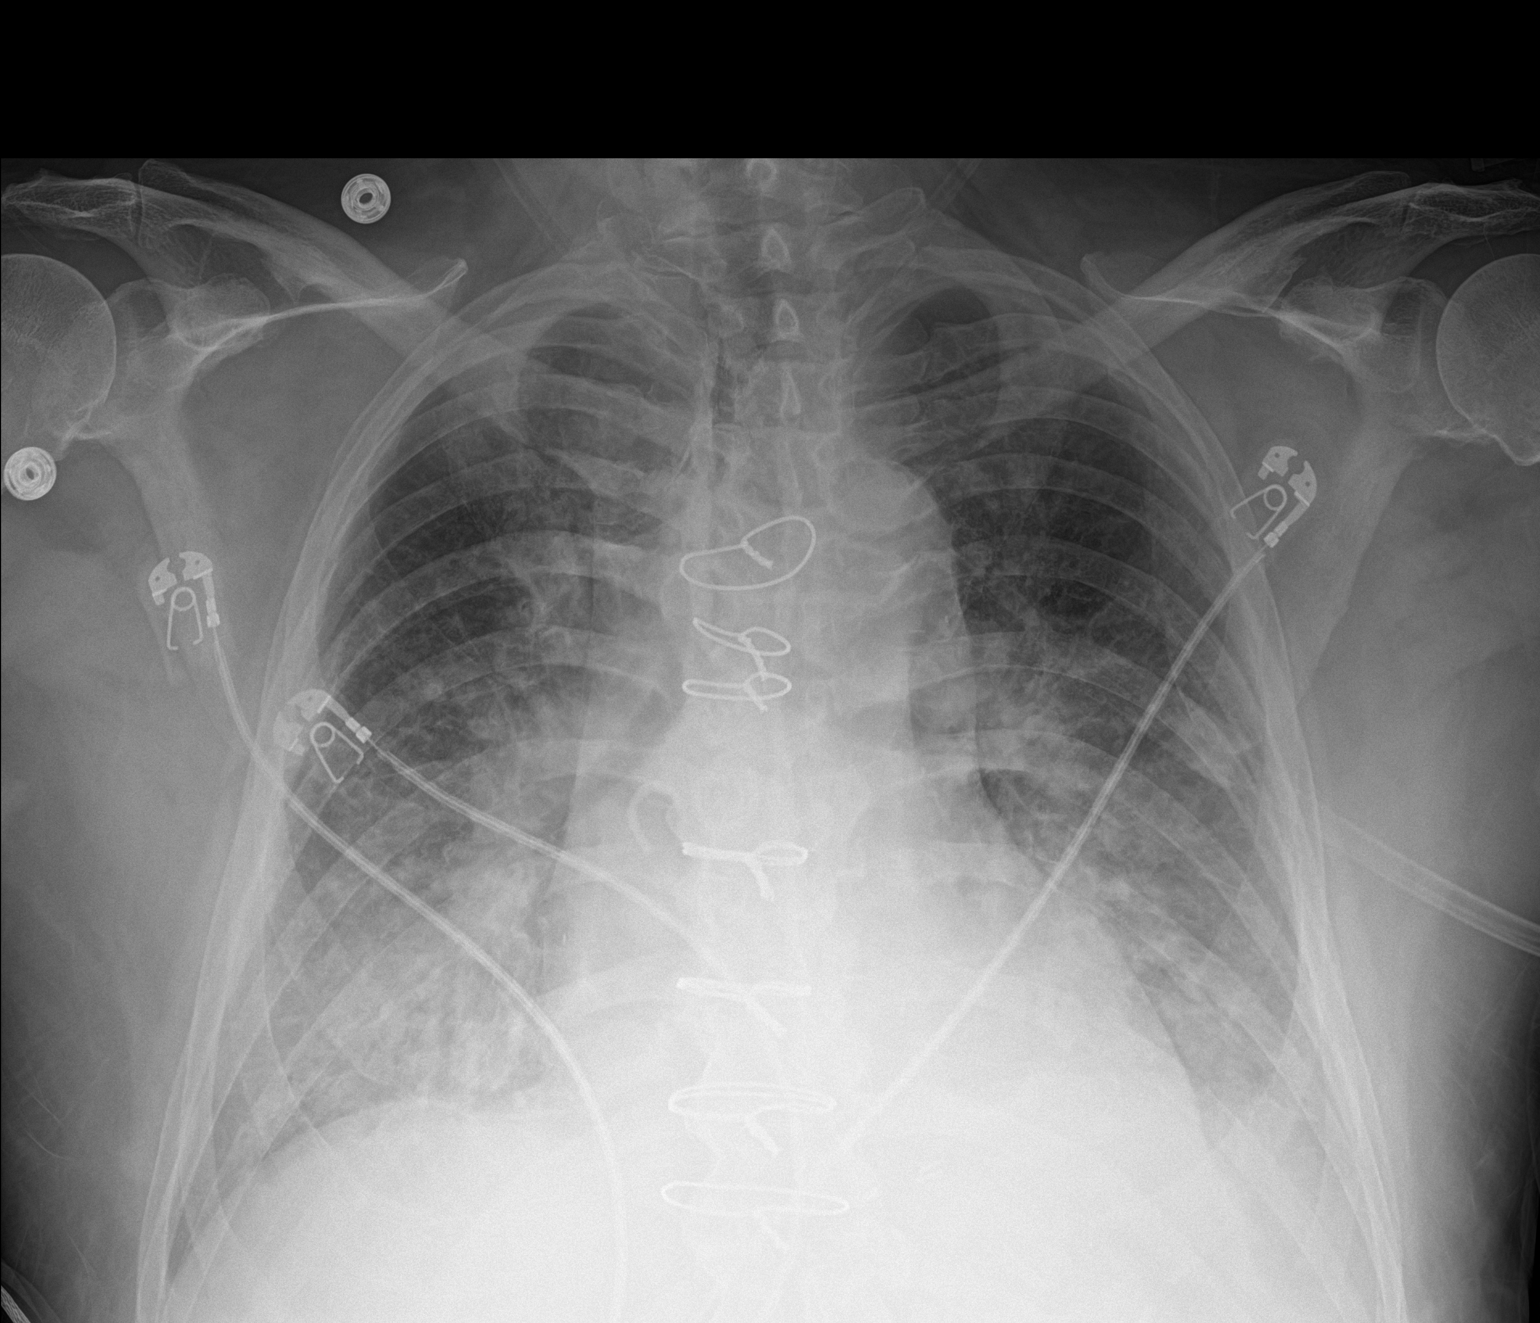

[1 of 1 positions shown; findings below may reference images not displayed]

FINDINGS: There is cephalization of the pulmonary vasculature, indistinctness
of the interstitial markings, and patchy airspace disease throughout
the lungs bilaterally suggestive of moderate pulmonary edema. Small
bilateral pleural effusions. Heart size is mildly enlarged. Upper
mediastinal contours are within normal limits. Aortic
atherosclerosis. Status post median sternotomy for CABG.
IMPRESSION: 1. The appearance of the chest suggests worsening congestive heart
failure, as above.
